# Patient Record
Sex: Female | Born: 1980 | Race: White | Hispanic: No | Marital: Married | Smoking: Never smoker
Health system: Southern US, Community
[De-identification: ages and names within clinical notes are randomized; demographics above are authoritative.]

## PROBLEM LIST (undated history)

## (undated) DIAGNOSIS — Z8719 Personal history of other diseases of the digestive system: Secondary | ICD-10-CM

## (undated) DIAGNOSIS — Z8639 Personal history of other endocrine, nutritional and metabolic disease: Secondary | ICD-10-CM

## (undated) DIAGNOSIS — R002 Palpitations: Secondary | ICD-10-CM

## (undated) DIAGNOSIS — E785 Hyperlipidemia, unspecified: Secondary | ICD-10-CM

## (undated) DIAGNOSIS — M62838 Other muscle spasm: Secondary | ICD-10-CM

## (undated) HISTORY — PX: TONSILLECTOMY: SUR1361

## (undated) HISTORY — PX: DILATION AND CURETTAGE OF UTERUS: SHX78

## (undated) HISTORY — DX: Palpitations: R00.2

## (undated) HISTORY — DX: Personal history of other endocrine, nutritional and metabolic disease: Z86.39

## (undated) HISTORY — DX: Personal history of other diseases of the digestive system: Z87.19

## (undated) HISTORY — DX: Other muscle spasm: M62.838

## (undated) HISTORY — DX: Hyperlipidemia, unspecified: E78.5

## (undated) HISTORY — PX: BREAST SURGERY: SHX581

---

## 2004-04-23 ENCOUNTER — Emergency Department (HOSPITAL_COMMUNITY): Admission: EM | Admit: 2004-04-23 | Discharge: 2004-04-23 | Payer: Self-pay | Admitting: Family Medicine

## 2004-10-03 ENCOUNTER — Other Ambulatory Visit: Admission: RE | Admit: 2004-10-03 | Discharge: 2004-10-03 | Payer: Self-pay | Admitting: Family Medicine

## 2004-10-15 ENCOUNTER — Encounter: Admission: RE | Admit: 2004-10-15 | Discharge: 2004-10-15 | Payer: Self-pay | Admitting: Family Medicine

## 2004-12-01 ENCOUNTER — Emergency Department (HOSPITAL_COMMUNITY): Admission: EM | Admit: 2004-12-01 | Discharge: 2004-12-01 | Payer: Self-pay | Admitting: Family Medicine

## 2005-03-04 ENCOUNTER — Emergency Department (HOSPITAL_COMMUNITY): Admission: EM | Admit: 2005-03-04 | Discharge: 2005-03-04 | Payer: Self-pay | Admitting: Family Medicine

## 2005-09-06 ENCOUNTER — Other Ambulatory Visit: Admission: RE | Admit: 2005-09-06 | Discharge: 2005-09-06 | Payer: Self-pay | Admitting: Family Medicine

## 2005-09-06 ENCOUNTER — Ambulatory Visit: Payer: Self-pay | Admitting: Family Medicine

## 2005-09-17 ENCOUNTER — Encounter: Admission: RE | Admit: 2005-09-17 | Discharge: 2005-09-17 | Payer: Self-pay | Admitting: Family Medicine

## 2006-03-07 ENCOUNTER — Ambulatory Visit: Payer: Self-pay | Admitting: Family Medicine

## 2006-03-18 ENCOUNTER — Ambulatory Visit: Payer: Self-pay

## 2006-04-08 ENCOUNTER — Ambulatory Visit: Payer: Self-pay | Admitting: Family Medicine

## 2006-06-02 ENCOUNTER — Ambulatory Visit: Payer: Self-pay | Admitting: Family Medicine

## 2006-09-18 ENCOUNTER — Encounter: Admission: RE | Admit: 2006-09-18 | Discharge: 2006-09-18 | Payer: Self-pay | Admitting: Family Medicine

## 2006-09-24 ENCOUNTER — Ambulatory Visit: Payer: Self-pay | Admitting: Family Medicine

## 2006-09-24 ENCOUNTER — Other Ambulatory Visit: Admission: RE | Admit: 2006-09-24 | Discharge: 2006-09-24 | Payer: Self-pay | Admitting: Family Medicine

## 2006-09-24 ENCOUNTER — Encounter: Payer: Self-pay | Admitting: Family Medicine

## 2007-02-22 ENCOUNTER — Emergency Department (HOSPITAL_COMMUNITY): Admission: EM | Admit: 2007-02-22 | Discharge: 2007-02-22 | Payer: Self-pay | Admitting: Family Medicine

## 2007-08-05 ENCOUNTER — Ambulatory Visit: Payer: Self-pay | Admitting: Family Medicine

## 2007-08-05 DIAGNOSIS — R599 Enlarged lymph nodes, unspecified: Secondary | ICD-10-CM | POA: Insufficient documentation

## 2007-08-06 LAB — CONVERTED CEMR LAB
Basophils Absolute: 0 10*3/uL (ref 0.0–0.1)
Basophils Relative: 0.3 % (ref 0.0–1.0)
Eosinophils Absolute: 0.1 10*3/uL (ref 0.0–0.6)
Eosinophils Relative: 1.5 % (ref 0.0–5.0)
HCT: 35 % — ABNORMAL LOW (ref 36.0–46.0)
Hemoglobin: 12.2 g/dL (ref 12.0–15.0)
Lymphocytes Relative: 23.3 % (ref 12.0–46.0)
MCHC: 34.7 g/dL (ref 30.0–36.0)
MCV: 86.8 fL (ref 78.0–100.0)
Monocytes Absolute: 0.4 10*3/uL (ref 0.2–0.7)
Monocytes Relative: 6.3 % (ref 3.0–11.0)
Neutro Abs: 4 10*3/uL (ref 1.4–7.7)
Neutrophils Relative %: 68.6 % (ref 43.0–77.0)
Platelets: 245 10*3/uL (ref 150–400)
RBC: 4.03 M/uL (ref 3.87–5.11)
RDW: 12.8 % (ref 11.5–14.6)
WBC: 5.9 10*3/uL (ref 4.5–10.5)

## 2007-11-11 ENCOUNTER — Encounter: Admission: RE | Admit: 2007-11-11 | Discharge: 2007-11-11 | Payer: Self-pay | Admitting: Family Medicine

## 2007-11-13 ENCOUNTER — Encounter (INDEPENDENT_AMBULATORY_CARE_PROVIDER_SITE_OTHER): Payer: Self-pay | Admitting: *Deleted

## 2008-06-13 ENCOUNTER — Encounter (INDEPENDENT_AMBULATORY_CARE_PROVIDER_SITE_OTHER): Payer: Self-pay | Admitting: Obstetrics and Gynecology

## 2008-06-13 ENCOUNTER — Ambulatory Visit (HOSPITAL_COMMUNITY): Admission: RE | Admit: 2008-06-13 | Discharge: 2008-06-13 | Payer: Self-pay | Admitting: Obstetrics and Gynecology

## 2008-08-26 ENCOUNTER — Encounter (INDEPENDENT_AMBULATORY_CARE_PROVIDER_SITE_OTHER): Payer: Self-pay | Admitting: Diagnostic Radiology

## 2008-08-26 ENCOUNTER — Encounter: Admission: RE | Admit: 2008-08-26 | Discharge: 2008-08-26 | Payer: Self-pay | Admitting: Obstetrics and Gynecology

## 2008-10-04 ENCOUNTER — Ambulatory Visit: Payer: Self-pay | Admitting: Family Medicine

## 2008-10-04 DIAGNOSIS — M62838 Other muscle spasm: Secondary | ICD-10-CM | POA: Insufficient documentation

## 2009-06-25 ENCOUNTER — Inpatient Hospital Stay (HOSPITAL_COMMUNITY): Admission: AD | Admit: 2009-06-25 | Discharge: 2009-06-25 | Payer: Self-pay | Admitting: Obstetrics and Gynecology

## 2009-06-26 ENCOUNTER — Inpatient Hospital Stay (HOSPITAL_COMMUNITY): Admission: AD | Admit: 2009-06-26 | Discharge: 2009-06-29 | Payer: Self-pay | Admitting: Obstetrics and Gynecology

## 2009-10-02 ENCOUNTER — Ambulatory Visit: Payer: Self-pay | Admitting: Family Medicine

## 2009-10-02 ENCOUNTER — Telehealth (INDEPENDENT_AMBULATORY_CARE_PROVIDER_SITE_OTHER): Payer: Self-pay | Admitting: *Deleted

## 2009-10-02 DIAGNOSIS — R209 Unspecified disturbances of skin sensation: Secondary | ICD-10-CM | POA: Insufficient documentation

## 2009-10-02 DIAGNOSIS — K5289 Other specified noninfective gastroenteritis and colitis: Secondary | ICD-10-CM

## 2009-10-03 ENCOUNTER — Telehealth (INDEPENDENT_AMBULATORY_CARE_PROVIDER_SITE_OTHER): Payer: Self-pay | Admitting: *Deleted

## 2009-10-03 LAB — CONVERTED CEMR LAB
BUN: 18 mg/dL (ref 6–23)
Basophils Absolute: 0 10*3/uL (ref 0.0–0.1)
Basophils Relative: 0.6 % (ref 0.0–3.0)
CO2: 28 meq/L (ref 19–32)
Calcium: 7.8 mg/dL — ABNORMAL LOW (ref 8.4–10.5)
Chloride: 103 meq/L (ref 96–112)
Creatinine, Ser: 0.7 mg/dL (ref 0.4–1.2)
Eosinophils Absolute: 0 10*3/uL (ref 0.0–0.7)
Eosinophils Relative: 0.2 % (ref 0.0–5.0)
Folate: >20 ng/mL
GFR calc non Af Amer: 104.25 mL/min (ref >60)
Glucose, Bld: 89 mg/dL (ref 70–99)
HCT: 36 % (ref 36.0–46.0)
Hemoglobin: 12.5 g/dL (ref 12.0–15.0)
Lymphocytes Relative: 7 % — ABNORMAL LOW (ref 12.0–46.0)
Lymphs Abs: 0.5 10*3/uL — ABNORMAL LOW (ref 0.7–4.0)
MCHC: 34.6 g/dL (ref 30.0–36.0)
MCV: 87.6 fL (ref 78.0–100.0)
Magnesium: 1.1 mg/dL — ABNORMAL LOW (ref 1.5–2.5)
Monocytes Absolute: 0.5 10*3/uL (ref 0.1–1.0)
Monocytes Relative: 6.9 % (ref 3.0–12.0)
Neutro Abs: 5.8 10*3/uL (ref 1.4–7.7)
Neutrophils Relative %: 85.3 % — ABNORMAL HIGH (ref 43.0–77.0)
Platelets: 174 10*3/uL (ref 150.0–400.0)
Potassium: 2.8 meq/L — CL (ref 3.5–5.1)
RBC: 4.11 M/uL (ref 3.87–5.11)
RDW: 12.5 % (ref 11.5–14.6)
Sodium: 139 meq/L (ref 135–145)
Vitamin B-12: 373 pg/mL (ref 211–911)
WBC: 6.8 10*3/uL (ref 4.5–10.5)

## 2009-10-05 ENCOUNTER — Ambulatory Visit: Payer: Self-pay | Admitting: Family Medicine

## 2009-10-10 LAB — CONVERTED CEMR LAB
BUN: 10 mg/dL (ref 6–23)
CO2: 21 meq/L (ref 19–32)
Calcium: 10.5 mg/dL (ref 8.4–10.5)
Chloride: 101 meq/L (ref 96–112)
Creatinine, Ser: 0.6 mg/dL (ref 0.4–1.2)
GFR calc non Af Amer: 124.54 mL/min (ref >60)
Glucose, Bld: 71 mg/dL (ref 70–99)
Potassium: 4.4 meq/L (ref 3.5–5.1)
Sodium: 137 meq/L (ref 135–145)

## 2009-10-12 ENCOUNTER — Ambulatory Visit: Payer: Self-pay | Admitting: Family Medicine

## 2009-10-13 ENCOUNTER — Telehealth (INDEPENDENT_AMBULATORY_CARE_PROVIDER_SITE_OTHER): Payer: Self-pay | Admitting: *Deleted

## 2009-10-13 LAB — CONVERTED CEMR LAB
BUN: 18 mg/dL (ref 6–23)
CO2: 27 meq/L (ref 19–32)
Calcium: 9 mg/dL (ref 8.4–10.5)
Chloride: 103 meq/L (ref 96–112)
Creatinine, Ser: 0.5 mg/dL (ref 0.4–1.2)
GFR calc non Af Amer: 153.69 mL/min (ref >60)
Glucose, Bld: 73 mg/dL (ref 70–99)
Magnesium: 1.9 mg/dL (ref 1.5–2.5)
Potassium: 3.9 meq/L (ref 3.5–5.1)
Sodium: 138 meq/L (ref 135–145)

## 2010-02-13 ENCOUNTER — Ambulatory Visit: Payer: Self-pay | Admitting: Family Medicine

## 2010-02-13 DIAGNOSIS — E876 Hypokalemia: Secondary | ICD-10-CM | POA: Insufficient documentation

## 2010-03-02 ENCOUNTER — Ambulatory Visit: Payer: Self-pay | Admitting: Family Medicine

## 2010-03-02 DIAGNOSIS — E785 Hyperlipidemia, unspecified: Secondary | ICD-10-CM | POA: Insufficient documentation

## 2010-03-13 ENCOUNTER — Telehealth (INDEPENDENT_AMBULATORY_CARE_PROVIDER_SITE_OTHER): Payer: Self-pay | Admitting: *Deleted

## 2010-03-13 LAB — CONVERTED CEMR LAB
ALT: 20 units/L (ref 0–35)
AST: 24 units/L (ref 0–37)
Albumin: 4.4 g/dL (ref 3.5–5.2)
Alkaline Phosphatase: 59 units/L (ref 39–117)
BUN: 14 mg/dL (ref 6–23)
Basophils Absolute: 0 10*3/uL (ref 0.0–0.1)
Basophils Relative: 0 % (ref 0.0–3.0)
Bilirubin, Direct: 0.1 mg/dL (ref 0.0–0.3)
CO2: 31 meq/L (ref 19–32)
Calcium: 9.8 mg/dL (ref 8.4–10.5)
Chloride: 107 meq/L (ref 96–112)
Cholesterol: 201 mg/dL — ABNORMAL HIGH (ref 0–200)
Creatinine, Ser: 0.7 mg/dL (ref 0.4–1.2)
Direct LDL: 152.7 mg/dL
Eosinophils Absolute: 0.1 10*3/uL (ref 0.0–0.7)
Eosinophils Relative: 1.7 % (ref 0.0–5.0)
GFR calc non Af Amer: 103.97 mL/min (ref >60)
Glucose, Bld: 80 mg/dL (ref 70–99)
HCT: 38.6 % (ref 36.0–46.0)
HDL: 41.3 mg/dL (ref >39.00)
Hemoglobin: 12.8 g/dL (ref 12.0–15.0)
Lymphocytes Relative: 21.2 % (ref 12.0–46.0)
Lymphs Abs: 1.6 10*3/uL (ref 0.7–4.0)
MCHC: 33.1 g/dL (ref 30.0–36.0)
MCV: 87.7 fL (ref 78.0–100.0)
Magnesium: 1.8 mg/dL (ref 1.5–2.5)
Monocytes Absolute: 0.5 10*3/uL (ref 0.1–1.0)
Monocytes Relative: 6.2 % (ref 3.0–12.0)
Neutro Abs: 5.5 10*3/uL (ref 1.4–7.7)
Neutrophils Relative %: 70.9 % (ref 43.0–77.0)
Platelets: 214 10*3/uL (ref 150.0–400.0)
Potassium: 3.6 meq/L (ref 3.5–5.1)
RBC: 4.4 M/uL (ref 3.87–5.11)
RDW: 13.1 % (ref 11.5–14.6)
Sodium: 142 meq/L (ref 135–145)
TSH: 5.77 microintl units/mL — ABNORMAL HIGH (ref 0.35–5.50)
Total Bilirubin: 0.3 mg/dL (ref 0.3–1.2)
Total CHOL/HDL Ratio: 5
Total Protein: 8.1 g/dL (ref 6.0–8.3)
Triglycerides: 97 mg/dL (ref 0.0–149.0)
VLDL: 19.4 mg/dL (ref 0.0–40.0)
WBC: 7.7 10*3/uL (ref 4.5–10.5)

## 2010-03-27 ENCOUNTER — Ambulatory Visit: Payer: Self-pay | Admitting: Family

## 2010-03-27 DIAGNOSIS — H6690 Otitis media, unspecified, unspecified ear: Secondary | ICD-10-CM

## 2010-03-27 DIAGNOSIS — E039 Hypothyroidism, unspecified: Secondary | ICD-10-CM | POA: Insufficient documentation

## 2010-03-27 DIAGNOSIS — J019 Acute sinusitis, unspecified: Secondary | ICD-10-CM | POA: Insufficient documentation

## 2010-03-27 LAB — CONVERTED CEMR LAB
Free T4: 0.7 ng/dL (ref 0.6–1.6)
T3, Free: 2.9 pg/mL (ref 2.3–4.2)
TSH: 3.3 microintl units/mL (ref 0.35–5.50)

## 2010-06-07 ENCOUNTER — Ambulatory Visit: Payer: Self-pay | Admitting: Family Medicine

## 2010-06-07 DIAGNOSIS — M7660 Achilles tendinitis, unspecified leg: Secondary | ICD-10-CM | POA: Insufficient documentation

## 2010-10-25 ENCOUNTER — Ambulatory Visit: Payer: Self-pay | Admitting: Family Medicine

## 2011-01-19 ENCOUNTER — Encounter: Payer: Self-pay | Admitting: Family Medicine

## 2011-01-20 ENCOUNTER — Encounter: Payer: Self-pay | Admitting: Obstetrics and Gynecology

## 2011-01-29 NOTE — Progress Notes (Signed)
Summary: discuss labs  Phone Note Outgoing Call   Call placed by: Doristine Devoid,  March 13, 2010 10:18 AM Call placed to: Patient Summary of Call: Pt maybe hypothyroid----recheck TSH, free T3,  free T4  244.9----next week Ideally your LDL (bad cholesterol) should be <_100___, your HDL (good cholesterol) should be >__50_ and your triglycerides should be< 150.  Diet and exercise will increase HDL and decrease the LDL and triglycerides. Read Dr. Danice Goltz book--Eat Drink and Be Healthy.  We will recheck labs in 3-6 months-----272.4  hep, lipid  Follow-up for Phone Call        patient aware of labs and appt scheduled to recheck.Marland KitchenMarland KitchenDoristine Devoid  March 13, 2010 10:18 AM

## 2011-01-29 NOTE — Progress Notes (Signed)
Summary: discuss labs  Phone Note Outgoing Call   Call placed by: Doristine Devoid,  October 13, 2009 4:37 PM Call placed to: Patient Summary of Call: all labs look good!  continue K+ 20 meq daily, continue Magnesium, con't Ca two times a day.  call with any questions or concerns  Follow-up for Phone Call        spoke with patient aware of labs and to continue meds.................Marland KitchenDoristine Devoid  October 13, 2009 4:37 PM     New/Updated Medications: KLOR-CON M20 20 MEQ CR-TABS (POTASSIUM CHLORIDE CRYS CR) take one tablet daily Prescriptions: KLOR-CON M20 20 MEQ CR-TABS (POTASSIUM CHLORIDE CRYS CR) take one tablet daily  #30 x 1   Entered by:   Doristine Devoid   Authorized by:   Neena Rhymes MD   Signed by:   Doristine Devoid on 10/13/2009   Method used:   [facility] to        [facility] [phone]* (retail)       [address]       [place], [place]  [address]       Ph: [phone]       Fax: [phone]   RxID:   [id number] SLOW-MAG 71.5-119 MG TBEC (MAGNESIUM CL-CALCIUM CARBONATE) take one tablet two times a day  #30 x 1   Entered by:   Doristine Devoid   Authorized by:   Neena Rhymes MD   Signed by:   Doristine Devoid on 10/13/2009   Method used:   Electronically to        [facility] [phone]* (retail)       [address]       [place], [place]  [address]       Ph: [phone]       Fax: [phone]   RxID:   [id number] KLOR-CON M20 20 MEQ CR-TABS (POTASSIUM CHLORIDE CRYS CR) take 2 tablets today, 1 two times a day on tues., then 1 tablet wed. and thurs.  #30 x 1   Entered by:   Doristine Devoid   Authorized by:   Neena Rhymes MD   Signed by:   Doristine Devoid on 10/13/2009   Method used:   Electronically to        [facility] [phone]* (retail)       [address]       [place], [place]  [address]       Ph: [phone]       Fax: [phone]   RxID:    [id number]

## 2011-01-29 NOTE — Letter (Signed)
   [facility] [address] [place], [place] [address] [phone]    March 27, 2010   [facility] [address] [place], [place] [address]  RE:  LAB RESULTS  Dear  Ms. Grandberry,  The following is an interpretation of your most recent lab tests.  Please take note of any instructions provided or changes to medications that have resulted from your lab work.   THYROID STUDIES:  Thyroid studies normal TSH: 3.30      Sincerely Yours,    Lemont Fillers FNP

## 2011-01-29 NOTE — Assessment & Plan Note (Signed)
Summary: muscle spasms//lch   Vital Signs:  Patient profile:   30 year old female Height:      62 inches Weight:      114 pounds BMI:     20.93 Temp:     97.4 degrees F oral Pulse rate:   73 / minute Pulse rhythm:   regular BP sitting:   98 / 60  (left arm) Cuff size:   regular  Vitals Entered By: Army Fossa CMA (February 13, 2010 3:59 PM) CC: Pt c/o upper back and neck pain onset yesterday. Pain is worse when laying down.    History of Present Illness: Pt here c/o pain back of neck.  Same as she previously had.   No new injury--pt sits at computer all day.  She is a IT consultant.    Current Medications (verified): 1)  Flexeril 10 Mg Tabs (Cyclobenzaprine Hcl) .Marland Kitchen.. 1 By Mouth Three Times A Day 2)  Prenatal Multivitamin-Ultra  Tabs (Prenatal Vit-Dss-Fe Cbn-Fa)  Allergies (verified): No Known Drug Allergies  Past History:  Past medical, surgical, family and social histories (including risk factors) reviewed for relevance to current acute and chronic problems.  Past Medical History: Reviewed history from 08/05/2007 and no changes required. Unremarkable  Family History: Reviewed history and no changes required.  Social History: Reviewed history and no changes required.  Review of Systems      See HPI  Physical Exam  General:  Well-developed,well-nourished,in no acute distress; alert,appropriate and cooperative throughout examination Neck:  No deformities, masses, or tenderness noted. Msk:  + muscle spasm b/ l traps normal ROM.   Neurologic:  alert & oriented X3 and gait normal.   Psych:  Oriented X3 and normally interactive.     Impression & Recommendations:  Problem # 1:  MUSCLE SPASM, TRAPEZIUS (ICD-728.85) flexeril 10 mg three times a day as needed  stretching warm moist compresses  Problem # 2:  HYPOKALEMIA (ICD-276.8) recheck labs  Complete Medication List: 1)  Flexeril 10 Mg Tabs (Cyclobenzaprine hcl) .Marland Kitchen.. 1 by mouth three times a day 2)   Prenatal Multivitamin-ultra Tabs (Prenatal vit-dss-fe cbn-fa)  Patient Instructions: 1)  276.8  272.4  lipid, bmp, hep, cbcd, tsh  Prescriptions: FLEXERIL 10 MG TABS (CYCLOBENZAPRINE HCL) 1 by mouth three times a day  #30 x 1   Entered and Authorized by:   Loreen Freud DO   Signed by:   Loreen Freud DO on 02/13/2010   Method used:   Electronically to        [facility] [phone]* (retail)       [address]       [place], [place]  [address]       Ph: [phone]       Fax: [phone]   RxID:   [id number]

## 2011-01-29 NOTE — Assessment & Plan Note (Signed)
Summary: lump on her right ear//ca  Medications Added TRI-SPRINTEC 0.035 MG TABS (NORGESTIMATE-ETHINYL ESTRADIOL)  AUGMENTIN 875-125 MG  TABS (AMOXICILLIN-POT CLAVULANATE) 1 by mouth two times a day      Allergies Added: NKDA  Vital Signs:  Patient Profile:   30 Years Old Female Weight:      109.4 pounds Pulse rate:   72 / minute BP sitting:   110 / 78  (left arm) Cuff size:   regular  Vitals Entered By: Shonna Chock (August 05, 2007 12:29 PM)               PCP:  Laury Axon  Chief Complaint:  LOOK AT LUMP ON RIGHT EAR ONSET: MON.  History of Present Illness: Pt here c/o lump behind R ear since Monday.  No other symptoms.    Current Allergies: No known allergies   Past Medical History:    Unremarkable     Review of Systems      See HPI  General      Denies chills, fatigue, fever, loss of appetite, malaise, sleep disorder, sweats, weakness, and weight loss.  ENT      Denies decreased hearing, difficulty swallowing, ear discharge, earache, hoarseness, nasal congestion, nosebleeds, postnasal drainage, ringing in ears, sinus pressure, and sore throat.  CV      Denies bluish discoloration of lips or nails, chest pain or discomfort, difficulty breathing at night, difficulty breathing while lying down, fainting, fatigue, leg cramps with exertion, lightheadness, near fainting, palpitations, shortness of breath with exertion, swelling of feet, swelling of hands, and weight gain.  Resp      Denies chest discomfort, chest pain with inspiration, cough, coughing up blood, excessive snoring, hypersomnolence, morning headaches, pleuritic, shortness of breath, sputum productive, and wheezing.   Physical Exam  General:     Well-developed,well-nourished,in no acute distress; alert,appropriate and cooperative throughout examination Head:     Normocephalic and atraumatic without obvious abnormalities. No apparent alopecia or balding. Ears:     External ear exam shows no  significant lesions or deformities.  Otoscopic examination reveals clear canals, tympanic membranes are intact bilaterally without bulging, retraction, inflammation or discharge. Hearing is grossly normal bilaterally. Nose:     External nasal examination shows no deformity or inflammation. Nasal mucosa are pink and moist without lesions or exudates. Mouth:     Oral mucosa and oropharynx without lesions or exudates.  Teeth in good repair. Neck:     R cervical adenopathy Lungs:     Normal respiratory effort, chest expands symmetrically. Lungs are clear to auscultation, no crackles or wheezes. Heart:     Normal rate and regular rhythm. S1 and S2 normal without gallop, murmur, click, rub or other extra sounds.    Impression & Recommendations:  Problem # 1:  CERVICAL LYMPHADENOPATHY, ANTERIOR, RIGHT (ICD-785.6) Rto after abx if no better ---consider US neck Her updated medication list for this problem includes:    Augmentin 875-125 Mg Tabs (Amoxicillin-pot clavulanate) .Marland Kitchen... 1 by mouth two times a day  Orders: Venipuncture (16109) TLB-CBC Platelet - w/Differential (85025-CBCD) Warm, moist compresses and symptomatic measures. Advised patient to complete all antibiotics.  Complete Medication List: 1)  Tri-sprintec 0.035 Mg Tabs (Norgestimate-ethinyl estradiol) 2)  Augmentin 875-125 Mg Tabs (Amoxicillin-pot clavulanate) .Marland Kitchen.. 1 by mouth two times a day     Prescriptions: AUGMENTIN 875-125 MG  TABS (AMOXICILLIN-POT CLAVULANATE) 1 by mouth two times a day  #20 x 0   Entered and Authorized by:   Loreen Freud DO  Signed by:   Loreen Freud DO on 08/05/2007   Method used:   Print then Give to Patient   RxID:   [id number]

## 2011-01-29 NOTE — Assessment & Plan Note (Signed)
Summary: sinus infection/labs,TSH, free T3,  free T4  244.9-/kdc   Vital Signs:  Patient profile:   30 year old female Height:      62 inches Weight:      109.38 pounds BMI:     20.08 Temp:     97.3 degrees F oral Pulse rate:   102 / minute Pulse rhythm:   regular Resp:     16 per minute BP sitting:   100 / 80  (right arm) Cuff size:   regular  Vitals Entered By: Mervin Kung CMA (March 27, 2010 11:43 AM) CC: room 16  Head congestion x 1 week. Ears feel stopped up bilaterally.    Primary Care Provider:  Laury Axon  CC:  room 16  Head congestion x 1 week. Ears feel stopped up bilaterally. Marland Kitchen  History of Present Illness: [name] presents with c/o sinus congestion x 7 days.  + Yellow nasal discharge and + sinus pressure.  Denies blurred vision.  99.8 fever on Saturday. Has tried sudafed x 1 minimal improvement.  Has also tried tylenol.  Patient has a 75 month old baby who is still nursing.   Also c/o right ear pain over the weekend, now pain is better but patient feels congested in both ears.    Allergies (verified): 1)  ! * Shellfish  Physical Exam  General:  Well-developed,well-nourished,in no acute distress; alert,appropriate and cooperative throughout examination Head:  Normocephalic and atraumatic without obvious abnormalities. No apparent alopecia or balding. Ears:  Bilateral bright red steaking at tops of TM's, + retraction Mouth:  Oral mucosa and oropharynx without lesions or exudates.  Teeth in good repair. Neck:  No deformities, masses, or tenderness noted. Lungs:  Normal respiratory effort, chest expands symmetrically. Lungs are clear to auscultation, no crackles or wheezes. Heart:  Normal rate and regular rhythm. S1 and S2 normal without gallop, murmur, click, rub or other extra sounds.   Impression & Recommendations:  Problem # 1:  SINUSITIS, ACUTE (ICD-461.9) Assessment New Will treat with amoxicillin, pt instructed to f/u per instructions below.   Her  updated medication list for this problem includes:    Amoxicillin 500 Mg Cap (Amoxicillin) .Marland Kitchen... Take 1 capsule by mouth three times a day x 10 days  Problem # 2:  HYPOTHYROIDISM (ICD-244.9) Patient was noted to have mild elevation of TSH on last lab draw. Denies fatigue or weight gain, will check TFT's Orders: Venipuncture (16109) TLB-TSH (Thyroid Stimulating Hormone) (84443-TSH) TLB-T3, Free (Triiodothyronine) (84481-T3FREE) TLB-T4 (Thyrox), Free [phone])  Problem # 3:  OTITIS MEDIA, BILATERAL (ICD-382.9) Assessment: New  Her updated medication list for this problem includes:    Amoxicillin 500 Mg Cap (Amoxicillin) .Marland Kitchen... Take 1 capsule by mouth three times a day x 10 days  Complete Medication List: 1)  Prenatal Multivitamin-ultra Tabs (Prenatal vit-dss-fe cbn-fa) 2)  Citracal Plus Tabs (Multiple minerals-vitamins) .... Take 1 tablet by mouth once a day 3)  Fenugreek  .... Take 1 capsule by mouth two times a day 4)  Amoxicillin 500 Mg Cap (Amoxicillin) .... Take 1 capsule by mouth three times a day x 10 days  Patient Instructions: 1)  Call if you develop fever over 101, increasing sinus pressure, pain with eye movement, increased facial tenderness of swelling, or if you develop visual changes. Prescriptions: AMOXICILLIN 500 MG CAP (AMOXICILLIN) Take 1 capsule by mouth three times a day X 10 days  #30 x 0   Entered and Authorized by:   Lemont Fillers FNP   Signed  by:   Lemont Fillers FNP on 03/27/2010   Method used:   Electronically to        [facility] [phone]* (retail)       [address]       [place], [place]  [address]       Ph: [phone]       Fax: [phone]   RxID:   [id number]   Current Allergies (reviewed today): ! * SHELLFISH

## 2011-01-29 NOTE — Assessment & Plan Note (Signed)
Summary: left ankle injury//kn   Vital Signs:  Patient profile:   30 year old female Weight:      110.38 pounds Pulse rate:   86 / minute Pulse rhythm:   regular BP sitting:   102 / 76  (left arm) Cuff size:   regular  Vitals Entered By: Army Fossa CMA (June 07, 2010 3:52 PM) CC: Pt here c/o left ankle pain x 2 weeks, not a severe pain. Noticied some swelling.   History of Present Illness:  Injury      This is a 30 year old woman who presents with An injury.  The symptoms began 2 weeks ago.  Pt felt pain in L ankle/ heel  while running about  1 1/2 weeks ago.  The patient reports injury to the left ankle.  The patient also reports swelling and tenderness.  The patient denies redness, increased warmth deformity, blood loss, numbness, weakness, loss of sensation, coolness of extremity, and loss of consciousness.  The patient denies the following risk factors for significant bleeding: aspirin use, anticoagulant use, and history of bleeding disorder.  Screening for risk of abuse was negative.  Pain has gotten worse since it occurred.    Current Medications (verified): 1)  Prenatal Multivitamin-Ultra  Tabs (Prenatal Vit-Dss-Fe Cbn-Fa) 2)  Citracal Plus  Tabs (Multiple Minerals-Vitamins) .... Take 1 Tablet By Mouth Once A Day 3)  Fenugreek .... Take 1 Capsule By Mouth Two Times A Day  Allergies: 1)  ! * Shellfish  Review of Systems      See HPI  Physical Exam  General:  Well-developed,well-nourished,in no acute distress; alert,appropriate and cooperative throughout examination Msk:  L ankle swollen around achilles,  no reddness , normal ROM and no joint warmth.   Pulses:  R posterior tibial normal, R dorsalis pedis normal, L posterior tibial normal, and L dorsalis pedis normal.   Neurologic:  alert & oriented X3.   Psych:  Oriented X3 and normally interactive.     Impression & Recommendations:  Problem # 1:  ACHILLES TENDINITIS, MILD  (ICD-726.71) ace elevation ice Ibuprofen rest call if no better or worsens and we will refer to ortho  Complete Medication List: 1)  Prenatal Multivitamin-ultra Tabs (Prenatal vit-dss-fe cbn-fa) 2)  Citracal Plus Tabs (Multiple minerals-vitamins) .... Take 1 tablet by mouth once a day 3)  Fenugreek  .... Take 1 capsule by mouth two times a day

## 2011-01-29 NOTE — Progress Notes (Signed)
       New/Updated Medications: SLOW-MAG 71.5-119 MG TBEC (MAGNESIUM CL-CALCIUM CARBONATE) take one tablet two times a day Prescriptions: SLOW-MAG 71.5-119 MG TBEC (MAGNESIUM CL-CALCIUM CARBONATE) take one tablet two times a day  #20 x 0   Entered by:   Doristine Devoid   Authorized by:   Neena Rhymes MD   Signed by:   Doristine Devoid on 10/03/2009   Method used:   Electronically to        [facility] [phone]* (retail)       [address]       [place], [place]  [address]       Ph: [phone]       Fax: [phone]   RxID:   [id number]   Appended Document:  discussed labs and prescription sent to pharmacy

## 2011-01-29 NOTE — Assessment & Plan Note (Signed)
Summary: nausea and vomiting/alr   Vital Signs:  Patient profile:   30 year old female Weight:      117 pounds Temp:     99.8 degrees F oral BP sitting:   110 / 66  (left arm)  Vitals Entered By: Doristine Devoid (October 02, 2009 1:33 PM) CC: nausea and vomitting along w/ diarrhea xsun.   History of Present Illness: 30 yo woman here today for nausea and vomiting.  sxs started after attending birthday party on Saturday.  vomiting didn't start until Sunday night, x3, and then developed diarrhea this AM x1.  no blood in stools.  denies abdominal pain.  + nausea.  tolerating fluids w/out difficulty.  2 other adults and 2 children also developed similar sxs.  subjective temps at home, low grade in office.  has 34 month old at home that she is breast-feeding.  at birthday party had [employer] nuggets and 'slow cooker pasta w/ beef and cheese'.  but remainder of people from party are well.  also c/o numbness in both arms from mid forearm into fingers, less in legs, and some tingling in face.  c/o weakness in grip.  no sxs prior to this AM.  Current Medications (verified): 1)  None  Allergies (verified): No Known Drug Allergies  Past History:  Past Medical History: Last updated: 08/05/2007 Unremarkable  Review of Systems      See HPI  Physical Exam  General:  pale, uncomfortable appearing Mouth:  MMM Lungs:  Normal respiratory effort, chest expands symmetrically. Lungs are clear to auscultation, no crackles or wheezes. Heart:  Normal rate and regular rhythm. S1 and S2 normal without gallop, murmur, click, rub or other extra sounds. Abdomen:  soft, NT/ND, + BS Pulses:  +2 radial, DP Extremities:  no C/C/E Neurologic:  strength in arms and legs 5/5 excellent discrimination w/ monofilament testing sensation intact to light touch   Impression & Recommendations:  Problem # 1:  GASTROENTERITIS (ICD-558.9) Assessment New likely viral.  will check electrolytes and blood count.   stressed importance of fluid, BRAT diet.  no phenergan as she is still breast feeding.  reviewed supportive care and red flags that should prompt return.  Pt expresses understanding and is in agreement w/ this plan. Orders: Venipuncture (16109) TLB-BMP (Basic Metabolic Panel-BMET) (80048-METABOL) TLB-CBC Platelet - w/Differential (85025-CBCD)  Problem # 2:  PARESTHESIA, HANDS (ICD-782.0) Assessment: New likely related to current illness as sxs coincide.  possibly positional.  bilateral sxs reassuring that nothing neurologic.  will check labs to ensure no vitamin deficiency.  Pt expresses understanding and is in agreement w/ this plan. Orders: TLB-B12 + Folate Pnl ([id number])  Patient Instructions: 1)  Please schedule a follow-up appointment as needed.  2)  This is likely viral and should continue to improve with time 3)  Please drink plenty of fluids- you can eat whenever you feel ready 4)  Immodium for diarrhea 5)  The tingling should improve as you do, but if not- please call 6)  Hang in there!! Prescriptions: PROMETHAZINE HCL 25 MG  TABS (PROMETHAZINE HCL) 1 tab by mouth Q6 as needed for nausea  #20 x 0   Entered and Authorized by:   Neena Rhymes MD   Signed by:   Neena Rhymes MD on 10/02/2009   Method used:   Print then Give to Patient   RxID:   [id number]

## 2011-01-29 NOTE — Assessment & Plan Note (Signed)
Summary: flu shot.cbs  Nurse Visit   Allergies: 1)  ! * Shellfish  Orders Added: 1)  Admin 1st Vaccine [90471] 2)  Flu Vaccine 78yrs + [16109] Flu Vaccine Consent Questions     Do you have a history of severe allergic reactions to this vaccine? no    Any prior history of allergic reactions to egg and/or gelatin? no    Do you have a sensitivity to the preservative Thimersol? no    Do you have a past history of Guillan-Barre Syndrome? no    Do you currently have an acute febrile illness? no    Have you ever had a severe reaction to latex? no    Vaccine information given and explained to patient? yes    Are you currently pregnant? no    Lot Number:[id number]   Exp Date:06/29/2011   Site Given  Left Deltoid IM .lbflu

## 2011-03-11 ENCOUNTER — Other Ambulatory Visit: Payer: Self-pay | Admitting: Obstetrics and Gynecology

## 2011-03-11 DIAGNOSIS — Z1231 Encounter for screening mammogram for malignant neoplasm of breast: Secondary | ICD-10-CM

## 2011-03-14 ENCOUNTER — Ambulatory Visit: Payer: Self-pay

## 2011-03-20 ENCOUNTER — Ambulatory Visit
Admission: RE | Admit: 2011-03-20 | Discharge: 2011-03-20 | Disposition: A | Discharge status: Home / Self Care | Payer: 59 | Source: Ambulatory Visit | Attending: Obstetrics and Gynecology | Admitting: Obstetrics and Gynecology

## 2011-03-20 DIAGNOSIS — Z1231 Encounter for screening mammogram for malignant neoplasm of breast: Secondary | ICD-10-CM

## 2011-04-08 LAB — CBC
HCT: 32.6 % — ABNORMAL LOW (ref 36.0–46.0)
HCT: 33 % — ABNORMAL LOW (ref 36.0–46.0)
HCT: 35.2 % — ABNORMAL LOW (ref 36.0–46.0)
HCT: 38.2 % (ref 36.0–46.0)
Hemoglobin: 11.3 g/dL — ABNORMAL LOW (ref 12.0–15.0)
Hemoglobin: 11.6 g/dL — ABNORMAL LOW (ref 12.0–15.0)
Hemoglobin: 12.3 g/dL (ref 12.0–15.0)
Hemoglobin: 13.1 g/dL (ref 12.0–15.0)
MCHC: 34.4 g/dL (ref 30.0–36.0)
MCHC: 34.6 g/dL (ref 30.0–36.0)
MCHC: 35 g/dL (ref 30.0–36.0)
MCHC: 35.2 g/dL (ref 30.0–36.0)
MCV: 89.5 fL (ref 78.0–100.0)
MCV: 90 fL (ref 78.0–100.0)
MCV: 90.2 fL (ref 78.0–100.0)
MCV: 90.9 fL (ref 78.0–100.0)
Platelets: 125 10*3/uL — ABNORMAL LOW (ref 150–400)
Platelets: 134 10*3/uL — ABNORMAL LOW (ref 150–400)
Platelets: 137 10*3/uL — ABNORMAL LOW (ref 150–400)
Platelets: 155 10*3/uL (ref 150–400)
RBC: 3.59 MIL/uL — ABNORMAL LOW (ref 3.87–5.11)
RBC: 3.67 MIL/uL — ABNORMAL LOW (ref 3.87–5.11)
RBC: 3.9 MIL/uL (ref 3.87–5.11)
RBC: 4.27 MIL/uL (ref 3.87–5.11)
RDW: 13.8 % (ref 11.5–15.5)
RDW: 13.9 % (ref 11.5–15.5)
RDW: 14.1 % (ref 11.5–15.5)
RDW: 14.4 % (ref 11.5–15.5)
WBC: 10.4 10*3/uL (ref 4.0–10.5)
WBC: 12.6 10*3/uL — ABNORMAL HIGH (ref 4.0–10.5)
WBC: 14.4 10*3/uL — ABNORMAL HIGH (ref 4.0–10.5)
WBC: 7.4 10*3/uL (ref 4.0–10.5)

## 2011-04-08 LAB — CCBB MATERNAL DONOR DRAW

## 2011-04-08 LAB — RPR: RPR Ser Ql: NONREACTIVE

## 2011-05-14 NOTE — H&P (Signed)
NAME:  Felicia Rollins, Felicia Rollins NO.:  [id number]   MEDICAL RECORD NO.:  [id number]          PATIENT TYPE:  AMB   LOCATION:  SDC                           FACILITY:  [facility]   PHYSICIAN:  Huel Cote, M.D. DATE OF BIRTH:  1981/05/15   DATE OF ADMISSION:  DATE OF DISCHARGE:                              HISTORY & PHYSICAL   The patient is a 30 year old G1 P0 who is coming in for a scheduled D&E  secondary to a finding of a fetal demise at approximately [redacted] weeks  gestation.  The patient had an initial ultrasound performed  approximately a week and half ago with a crown-rump length of 8 weeks 1  day and fetal heart motion which was slightly slower than expected and  had a follow-up ultrasound 1 week later which confirmed no cardiac  activity consistent with a fetal demise at 8 weeks 1 day.  She was  counseled regarding her options and wished to proceed with a D&C.   PAST MEDICAL HISTORY:  None.   PAST SURGICAL HISTORY:  Tonsillectomy.   PAST GYNECOLOGIC HISTORY:  No abnormal Pap smears.   MEDICATIONS:  None.   ALLERGIES:  ALLERGIC TO SHELLFISH ONLY.   FAMILY HISTORY:  Significant for her mother having breast cancer at 93  years old with a double mastectomy.  There are no birth defects either  in she or her partner's family.   PHYSICAL EXAMINATION:  Her weight is 113 pounds.  Her blood pressure is  normal at 94/60.  CARDIAC EXAM:  Is regular rate and rhythm.  LUNGS:  Clear.  ABDOMEN:  Is soft and nontender.  On pelvic exam, uterus is consistent with probable 8-9 weeks' gestation.   She was counseled as to the risks and benefits of surgery including  bleeding and uterine perforation.  She understands the risks of D&E and  desires to proceed with the surgery stated.  We discussed the use of  Cytotec to minimize her surgical risk of perforation, and she is  agreeable to this.  She will place a Cytotec tablet 400 mcg in the  vagina 2 to 3 hours prior to the procedure on  the day of surgery.  All  questions were answered, and the procedure reviewed in detail, and the  patient is comfortable with this decision.      Huel Cote, M.D.  Electronically Signed     KR/MEDQ  D:  06/10/2008  T:  06/10/2008  Job:  [id number]

## 2011-05-14 NOTE — Op Note (Signed)
Felicia Rollins, Felicia Rollins NO.:  [id number]   MEDICAL RECORD NO.:  [id number]          PATIENT TYPE:  AMB   LOCATION:  SDC                           FACILITY:  [facility]   PHYSICIAN:  Huel Cote, M.D. DATE OF BIRTH:  [birth year]   DATE OF PROCEDURE:  06/13/2008  DATE OF DISCHARGE:                               OPERATIVE REPORT   PREOPERATIVE DIAGNOSIS:  Missed abortion at 8+ weeks.   POSTOPERATIVE DIAGNOSIS:  Missed abortion at 8+ weeks.   PROCEDURE:  Suction D&E.   ANESTHESIA:  MAC with 1% paracervical block of lidocaine.   SPECIMENS:  Products of conception were sent to pathology.   FINDINGS:  The uterus was 8-week size.   ESTIMATED BLOOD LOSS:  Minimal.   COMPLICATIONS:  None.   PROCEDURE:  The patient was taken to the operating room where MAC  anesthesia was obtained without difficulty.  She was then prepped and  draped in normal sterile fashion in the dorsal lithotomy position.  A  speculum was placed within the vagina after sterile prep, and the cervix  was identified and injected with 2 mL of lidocaine on the anterior lip  and grasped with tenaculum.  An additional 18 mL was injected with  approximately 9 mL at each 2 and 10 o'clock position for a paracervical  block.  The uterus was sounded to approximately 8 with the sound easily,  secondary to the patient's previous Cytotec placement.  She was then  sequentially dilated with the dilators up to approximately 24.  The 8-mm  suction curette was then introduced into the fundus without difficulty  and suction applied.  In several passes, a moderate amount of products  of conception were obtained, and this was repeated until no further  tissue appeared forthcoming.  Suction was then discontinued and a sharp  curette was placed into the uterus.  Curetting of all four quadrants  produced no further significant tissue.  There was some decidua present.  Therefore, 2 more passes with the suction were performed;  however, no  further tissue was obtained.  The patient had very minimal bleeding at  the conclusion of the procedure.  The tenaculum was removed and one area  of tenaculum site bleeding was treated with silver nitrate.  The sponge,  lap, and needle counts were correct x2, and the patient was then  awakened and taken to the recovery room in stable condition.      Huel Cote, M.D.  Electronically Signed     KR/MEDQ  D:  06/13/2008  T:  06/13/2008  Job:  [id number]

## 2011-05-14 NOTE — Discharge Summary (Signed)
Felicia Rollins, Felicia Rollins             ACCOUNT NO.:  [id number]   MEDICAL RECORD NO.:  [id number]          PATIENT TYPE:  INP   LOCATION:  9124                          FACILITY:  [facility]   PHYSICIAN:  Sherron Monday, MD        DATE OF BIRTH:  08-28-1981   DATE OF ADMISSION:  06/26/2009  DATE OF DISCHARGE:  06/29/2009                               DISCHARGE SUMMARY   ADMITTING DIAGNOSIS:  Intrauterine pregnancy at term in early labor.   DISCHARGE DIAGNOSES:  Intrauterine pregnancy at term in early labor,  delivered via vacuum-assisted vaginal delivery, as well as labial edema.   HISTORY OF PRESENT ILLNESS:  A 30 year old G2, P 0-0-1-0 at 58 plus 6  with an EDC of June 22 by 9-week ultrasound consistent with LMP,  presents to the MAU in early labor with cervix 1-2 cm dilated, 80%  effaced, and -2 station, very uncomfortable and admitted for pain  control and possible augmentation, progressed to 5 cm at 5:30 a.m. after  epidural and started on Pitocin to augment.  Prenatal care is uneventful  except positive group B strep.   PAST MEDICAL HISTORY:  Not significant.   PAST SURGICAL HISTORY:  Significant for D and C following a miscarriage,  tonsillectomy, and a right breast biopsy.   PAST OBSTETRICS AND GYNECOLOGIC HISTORY:  G1 was a miscarriage.  G2 is  present pregnancy.  No history of any abnormal Pap smears or sexually  transmitted diseases.   MEDICATIONS:  Prenatal vitamins.   ALLERGIES:  SHELLFISH.  However, no known drug allergies.   SOCIAL HISTORY:  The patient denies alcohol, tobacco, or drug use.   FAMILY HISTORY:  Significant for breast cancer in the mother.   HOSPITAL COURSE:  On admission, she is afebrile.  Vital signs stable and  a benign exam.  Her water was ruptured and it was clear fluid.  She was  complete and 0 station, had pushed for approximately 45 minutes with  good effort and minimal descent.  The patient rested and exaggerated  since and retried pushing  approximately an hour.  Baby had descended  after this rest.  She pushed and was in the 120s and reactive.  She  pushed very well for 2-1/2 hours with slow progress.  At this time, the  patient opted for vacuum-assisted vaginal delivery.  After risks,  benefits and alternatives were discussed, the bladder was sterilely  drained and the Kiwi was applied with 6 pulls and 2 pop-offs.  Infant  was delivered from OA to LOT.  Immediately after delivery, the baby was  depressed, NICU was called in and was at the bedside within 2 minutes of  life.  Apgars were 1, 4, and 9, weight was 7 pounds 4 ounces, infant was  delivered at 13:36.  Placenta was expressed at 13:39.  First-degree  perineal laceration was repaired with 2-0 Vicryl and it was a somewhat  stellate lesion.  EBL was less than 500 mL.  After resuscitation, the  NICU decided that infant could stay with the mother.  Her postoperative  course was uncomplicated aside from labial  edema.  Her hemoglobin was  closely followed to make sure that it was not a collecting hematoma.  Her hemoglobin decreased from 13.1 to 12.3 and was stable at 11.6 and  11.3 over the next several days.  She was discharged to home on  postpartum day #3.  At this time, she was doing well, had failed several  voiding trials, and she will be discharged with a Foley catheter.  She  is educated on this.  She will follow up in the office on Monday for  discontinuation of the Foley and another voiding trial.  She voiced  understanding to all of this and was discharged to home with routine  discharge instructions and numbers to call with any questions or  problems including prescriptions for Motrin, Percocet, Anusol, and  Macrodantin for urinary prophylaxis secondary to the Foley.  She will  also start progesterone-only pills for contraception.   DISCHARGE INFORMATION:  She is O negative, rubella immune,  breastfeeding, and she will restart her pills.  Her hemoglobin was   decreased but was stable.      Sherron Monday, MD  Electronically Signed     JB/MEDQ  D:  06/29/2009  T:  06/30/2009  Job:  [id number]

## 2011-06-21 ENCOUNTER — Encounter: Payer: Self-pay | Admitting: Family Medicine

## 2011-09-26 LAB — RH IMMUNE GLOBULIN WORKUP (NOT WOMEN'S HOSP)
ABO/RH(D): O NEG
Antibody Screen: NEGATIVE

## 2011-09-26 LAB — CBC
HCT: 32.8 — ABNORMAL LOW
Hemoglobin: 11.7 — ABNORMAL LOW
MCHC: 35.6
MCV: 88.9
Platelets: 183
RBC: 3.69 — ABNORMAL LOW
RDW: 12.9
WBC: 4.8

## 2011-10-10 ENCOUNTER — Encounter: Payer: Self-pay | Admitting: Family Medicine

## 2011-10-10 ENCOUNTER — Ambulatory Visit (INDEPENDENT_AMBULATORY_CARE_PROVIDER_SITE_OTHER): Payer: 59 | Admitting: Family Medicine

## 2011-10-10 ENCOUNTER — Ambulatory Visit: Payer: 59 | Admitting: Family Medicine

## 2011-10-10 DIAGNOSIS — J40 Bronchitis, not specified as acute or chronic: Secondary | ICD-10-CM

## 2011-10-10 MED ORDER — BENZONATATE 200 MG PO CAPS: 200.0000 mg | ORAL_CAPSULE | Freq: Three times a day (TID) | ORAL | Status: AC | PRN

## 2011-10-10 MED ORDER — AZITHROMYCIN 250 MG PO TABS: 250.0000 mg | ORAL_TABLET | Freq: Every day | ORAL | Status: AC

## 2011-10-10 NOTE — Assessment & Plan Note (Signed)
Start Zpack.  Cough meds prn.  mucinex to thin congestion.  Start OTC antihistamine to tx allergy component.  Reviewed supportive care and red flags that should prompt return.  Pt expressed understanding and is in agreement w/ plan.

## 2011-10-10 NOTE — Patient Instructions (Signed)
Take the Azithromycin as directed Drink plenty of fluids Continue the Mucinex to thin your congestion Start Claritin or Zyrtec daily Use the tessalon as needed for cough Call with any questions or concerns Hang in there!!!

## 2011-10-10 NOTE — Progress Notes (Signed)
  Subjective:    Patient ID: Felicia Rollins, female    DOB: 09-Sep-1981, 30 y.o.   MRN: [id number]  HPI Cough- sxs started 5 days ago, worse at night.  Feels 'stuck in chest'.  Cough is nonproductive.  Has chest tightness at night.  No fevers.  No ear pain, facial pain, nasal congestion.  + sick contacts.   Review of Systems For ROS see HPI     Objective:   Physical Exam  Vitals reviewed. Constitutional: She appears well-developed and well-nourished. No distress.  HENT:  Head: Normocephalic and atraumatic.       TMs normal bilaterally Mild nasal congestion Throat w/out erythema, edema, or exudate  Eyes: Conjunctivae and EOM are normal. Pupils are equal, round, and reactive to light.  Neck: Normal range of motion. Neck supple.  Cardiovascular: Normal rate, regular rhythm, normal heart sounds and intact distal pulses.   No murmur heard. Pulmonary/Chest: Effort normal and breath sounds normal. No respiratory distress. She has no wheezes.       + hacking cough  Lymphadenopathy:    She has no cervical adenopathy.          Assessment & Plan:

## 2011-11-01 ENCOUNTER — Ambulatory Visit (INDEPENDENT_AMBULATORY_CARE_PROVIDER_SITE_OTHER): Payer: 59

## 2011-11-01 DIAGNOSIS — Z23 Encounter for immunization: Secondary | ICD-10-CM

## 2011-12-30 ENCOUNTER — Encounter: Payer: Self-pay | Admitting: Family Medicine

## 2011-12-30 ENCOUNTER — Ambulatory Visit (INDEPENDENT_AMBULATORY_CARE_PROVIDER_SITE_OTHER): Payer: 59 | Admitting: Family Medicine

## 2011-12-30 VITALS — BP 105/70 | HR 76 | Temp 97.8°F | Ht 65.0 in | Wt 113.8 lb

## 2011-12-30 DIAGNOSIS — M62838 Other muscle spasm: Secondary | ICD-10-CM | POA: Insufficient documentation

## 2011-12-30 MED ORDER — CYCLOBENZAPRINE HCL 10 MG PO TABS: 10.0000 mg | ORAL_TABLET | Freq: Three times a day (TID) | ORAL | Status: DC | PRN

## 2011-12-30 MED ORDER — NAPROXEN 500 MG PO TABS: 500.0000 mg | ORAL_TABLET | Freq: Two times a day (BID) | ORAL | Status: DC

## 2011-12-30 NOTE — Patient Instructions (Signed)
This is a trapezius spasm Start the Flexeril Use a heating pad Take the Naproxen twice daily w/ food Call with any questions or concerns Hang in there! Happy Holidays!!!

## 2011-12-30 NOTE — Progress Notes (Signed)
  Subjective:    Patient ID: Felicia Rollins, female    DOB: 10/18/1981, 30 y.o.   MRN: [id number]  HPI Back spasm- hx of similar.  Recurred this weekend.  Pain is R sided, located in trap.  Pain is worse w/ extension of neck, some at extreme flexion.  No relief w/ ibuprofen or aleve.  No numbness, tingling, weakness.   Review of Systems For ROS see HPI     Objective:   Physical Exam  Vitals reviewed. Constitutional: She appears well-developed and well-nourished.  Neck:       Slightly limited flexion and extension.  Good rotation. Very tight trap spasm on R          Assessment & Plan:

## 2011-12-31 NOTE — L&D Delivery Note (Signed)
Operative Delivery Note Pt pushed for about 2 hours, able to bring vtx to +2 station, FHT with recent late decels.    At 10:10 PM a viable female was delivered via Vaginal, Vacuum Investment banker, operational).  Presentation: vertex; Position: Right,, Occiput,, Anterior; Station: +2.  Verbal consent: obtained from patient.  Risks and benefits discussed in detail.  Risks include, but are not limited to the risks of anesthesia, bleeding, infection, damage to maternal tissues, fetal cephalhematoma.  There is also the risk of inability to effect vaginal delivery of the head, or shoulder dystocia that cannot be resolved by established maneuvers, leading to the need for emergency cesarean section.  Good epidural, bladder drained of about 50 cc clear urine.  Mushroom cup applied, with 2 ctx vtx brought to +3, but vacuum would then not maintain suction.  With a couple more pushes, vtx did not move.  Bell cup applied, 3 pulls but still not good suction.  At this time I became suspicious that the expulsive effort was not adequate.  I applied fundal pressure between ctx and was able to push vtx to +4.  She was then able to push out the vertex.  There was then a minimal shoulder dystocia that resolved with McRoberts maneuver only.  APGAR: 4, 8; weight 8 lb 8.2 oz (3860 g).   Placenta status: Intact, Spontaneous.   Cord: 3 vessels with the following complications: None.  Anesthesia: Epidural  Instruments: mushroom and bell cups Episiotomy: None Lacerations:  Irregular 2nd degree Suture Repair: 3.0 vicryl rapide Est. Blood Loss (mL): 450  Mom to postpartum.  Baby to stay with mom.  Trenise Turay D 12/13/2012, 10:38 PM

## 2011-12-31 NOTE — Assessment & Plan Note (Signed)
Pt w/ hx of similar.  Start NSAIDs and muscle relaxers.  Reviewed supportive care and red flags that should prompt return.  Pt expressed understanding and is in agreement w/ plan.

## 2012-01-31 ENCOUNTER — Ambulatory Visit: Payer: 59 | Admitting: Family Medicine

## 2012-03-11 ENCOUNTER — Other Ambulatory Visit: Payer: Self-pay | Admitting: Obstetrics and Gynecology

## 2012-03-11 DIAGNOSIS — Z1231 Encounter for screening mammogram for malignant neoplasm of breast: Secondary | ICD-10-CM

## 2012-03-31 ENCOUNTER — Ambulatory Visit: Payer: 59

## 2012-04-09 ENCOUNTER — Ambulatory Visit: Payer: 59

## 2012-05-08 LAB — OB RESULTS CONSOLE HIV ANTIBODY (ROUTINE TESTING): HIV: NONREACTIVE

## 2012-05-08 LAB — OB RESULTS CONSOLE HEPATITIS B SURFACE ANTIGEN: Hepatitis B Surface Ag: NEGATIVE

## 2012-05-08 LAB — OB RESULTS CONSOLE RPR: RPR: NONREACTIVE

## 2012-05-08 LAB — OB RESULTS CONSOLE GC/CHLAMYDIA
Chlamydia: NEGATIVE
Gonorrhea: NEGATIVE

## 2012-05-08 LAB — OB RESULTS CONSOLE ABO/RH: RH Type: NEGATIVE

## 2012-05-08 LAB — OB RESULTS CONSOLE RUBELLA ANTIBODY, IGM: Rubella: IMMUNE

## 2012-10-28 LAB — HM PAP SMEAR: HM Pap smear: NORMAL

## 2012-11-16 LAB — OB RESULTS CONSOLE GBS: GBS: POSITIVE

## 2012-12-07 ENCOUNTER — Telehealth (HOSPITAL_COMMUNITY): Payer: Self-pay | Admitting: *Deleted

## 2012-12-07 ENCOUNTER — Encounter (HOSPITAL_COMMUNITY): Payer: Self-pay | Admitting: *Deleted

## 2012-12-07 NOTE — Telephone Encounter (Signed)
Preadmission screen  

## 2012-12-13 ENCOUNTER — Encounter (HOSPITAL_COMMUNITY): Payer: Self-pay | Admitting: Anesthesiology

## 2012-12-13 ENCOUNTER — Encounter (HOSPITAL_COMMUNITY): Payer: Self-pay | Admitting: *Deleted

## 2012-12-13 ENCOUNTER — Inpatient Hospital Stay (HOSPITAL_COMMUNITY): Payer: 59 | Admitting: Anesthesiology

## 2012-12-13 ENCOUNTER — Inpatient Hospital Stay (HOSPITAL_COMMUNITY)
Admission: AD | Admit: 2012-12-13 | Discharge: 2012-12-15 | DRG: 775 | Disposition: A | Discharge status: Home / Self Care | Payer: 59 | Source: Ambulatory Visit | Attending: Obstetrics and Gynecology | Admitting: Obstetrics and Gynecology

## 2012-12-13 DIAGNOSIS — Z22330 Carrier of Group B streptococcus: Secondary | ICD-10-CM

## 2012-12-13 DIAGNOSIS — O99892 Other specified diseases and conditions complicating childbirth: Secondary | ICD-10-CM | POA: Diagnosis present

## 2012-12-13 LAB — CBC
HCT: 33.4 % — ABNORMAL LOW (ref 36.0–46.0)
Hemoglobin: 11.2 g/dL — ABNORMAL LOW (ref 12.0–15.0)
MCH: 29.7 pg (ref 26.0–34.0)
MCHC: 33.5 g/dL (ref 30.0–36.0)
MCV: 88.6 fL (ref 78.0–100.0)
Platelets: 165 10*3/uL (ref 150–400)
RBC: 3.77 MIL/uL — ABNORMAL LOW (ref 3.87–5.11)
RDW: 13.7 % (ref 11.5–15.5)
WBC: 8.5 10*3/uL (ref 4.0–10.5)

## 2012-12-13 LAB — TYPE AND SCREEN
ABO/RH(D): O NEG
Antibody Screen: POSITIVE
DAT, IgG: NEGATIVE

## 2012-12-13 LAB — RPR: RPR Ser Ql: NONREACTIVE

## 2012-12-13 MED ORDER — FLEET ENEMA 7-19 GM/118ML RE ENEM: 1.0000 | ENEMA | Freq: Once | RECTAL | Status: DC

## 2012-12-13 MED ORDER — IBUPROFEN 600 MG PO TABS: 600.0000 mg | ORAL_TABLET | Freq: Four times a day (QID) | ORAL | Status: DC | PRN

## 2012-12-13 MED ORDER — OXYTOCIN 40 UNITS IN LACTATED RINGERS INFUSION - SIMPLE MED
62.5000 mL/h | INTRAVENOUS | Status: DC
  Filled 2012-12-13: qty 1000

## 2012-12-13 MED ORDER — CITRIC ACID-SODIUM CITRATE 334-500 MG/5ML PO SOLN: 30.0000 mL | ORAL | Status: DC | PRN

## 2012-12-13 MED ORDER — LIDOCAINE HCL (PF) 1 % IJ SOLN
INTRAMUSCULAR | Status: DC | PRN
  Administered 2012-12-13 (×2): 9 mL

## 2012-12-13 MED ORDER — LACTATED RINGERS IV SOLN
INTRAVENOUS | Status: DC
  Administered 2012-12-13: 21:00:00 via INTRAVENOUS

## 2012-12-13 MED ORDER — OXYCODONE-ACETAMINOPHEN 5-325 MG PO TABS: 1.0000 | ORAL_TABLET | ORAL | Status: DC | PRN

## 2012-12-13 MED ORDER — LACTATED RINGERS IV SOLN
500.0000 mL | INTRAVENOUS | Status: DC | PRN
  Administered 2012-12-13: 999 mL via INTRAVENOUS
  Administered 2012-12-13: 500 mL via INTRAVENOUS

## 2012-12-13 MED ORDER — PENICILLIN G POTASSIUM 5000000 UNITS IJ SOLR
2.5000 10*6.[IU] | INTRAMUSCULAR | Status: DC
  Administered 2012-12-13: 2.5 10*6.[IU] via INTRAVENOUS
  Filled 2012-12-13 (×6): qty 2.5

## 2012-12-13 MED ORDER — PHENYLEPHRINE 40 MCG/ML (10ML) SYRINGE FOR IV PUSH (FOR BLOOD PRESSURE SUPPORT): 80.0000 ug | PREFILLED_SYRINGE | INTRAVENOUS | Status: DC | PRN

## 2012-12-13 MED ORDER — ONDANSETRON HCL 4 MG/2ML IJ SOLN: 4.0000 mg | Freq: Four times a day (QID) | INTRAMUSCULAR | Status: DC | PRN

## 2012-12-13 MED ORDER — FENTANYL 2.5 MCG/ML BUPIVACAINE 1/10 % EPIDURAL INFUSION (WH - ANES)
14.0000 mL/h | INTRAMUSCULAR | Status: DC
  Filled 2012-12-13: qty 125

## 2012-12-13 MED ORDER — OXYTOCIN 40 UNITS IN LACTATED RINGERS INFUSION - SIMPLE MED
1.0000 m[IU]/min | INTRAVENOUS | Status: DC
  Administered 2012-12-13: 1 m[IU]/min via INTRAVENOUS

## 2012-12-13 MED ORDER — EPHEDRINE 5 MG/ML INJ: 10.0000 mg | INTRAVENOUS | Status: DC | PRN

## 2012-12-13 MED ORDER — DIPHENHYDRAMINE HCL 50 MG/ML IJ SOLN: 12.5000 mg | INTRAMUSCULAR | Status: DC | PRN

## 2012-12-13 MED ORDER — FENTANYL 2.5 MCG/ML BUPIVACAINE 1/10 % EPIDURAL INFUSION (WH - ANES)
INTRAMUSCULAR | Status: DC | PRN
  Administered 2012-12-13: 14 mL/h via EPIDURAL

## 2012-12-13 MED ORDER — ACETAMINOPHEN 325 MG PO TABS: 650.0000 mg | ORAL_TABLET | ORAL | Status: DC | PRN

## 2012-12-13 MED ORDER — TERBUTALINE SULFATE 1 MG/ML IJ SOLN: 0.2500 mg | Freq: Once | INTRAMUSCULAR | Status: AC | PRN

## 2012-12-13 MED ORDER — PENICILLIN G POTASSIUM 5000000 UNITS IJ SOLR
5.0000 10*6.[IU] | Freq: Once | INTRAVENOUS | Status: AC
  Administered 2012-12-13: 5 10*6.[IU] via INTRAVENOUS
  Filled 2012-12-13: qty 5

## 2012-12-13 MED ORDER — LACTATED RINGERS IV SOLN
500.0000 mL | Freq: Once | INTRAVENOUS | Status: AC
  Administered 2012-12-13: 500 mL via INTRAVENOUS

## 2012-12-13 MED ORDER — LIDOCAINE HCL (PF) 1 % IJ SOLN
30.0000 mL | INTRAMUSCULAR | Status: DC | PRN
  Filled 2012-12-13: qty 30

## 2012-12-13 MED ORDER — EPHEDRINE 5 MG/ML INJ
10.0000 mg | INTRAVENOUS | Status: DC | PRN
  Filled 2012-12-13: qty 4

## 2012-12-13 MED ORDER — PHENYLEPHRINE 40 MCG/ML (10ML) SYRINGE FOR IV PUSH (FOR BLOOD PRESSURE SUPPORT)
80.0000 ug | PREFILLED_SYRINGE | INTRAVENOUS | Status: DC | PRN
  Filled 2012-12-13: qty 5

## 2012-12-13 MED ORDER — OXYTOCIN BOLUS FROM INFUSION: 500.0000 mL | INTRAVENOUS | Status: DC

## 2012-12-13 NOTE — MAU Note (Signed)
Pt presents with contractions overnight increasing in intensity and frequency; q 4-5 min for last 1 hour and 20 min. Pt denies pih symptoms and srom.

## 2012-12-13 NOTE — Anesthesia Procedure Notes (Signed)
Epidural Patient location during procedure: OB Start time: 12/13/2012 4:39 PM End time: 12/13/2012 4:45 PM  Staffing Anesthesiologist: Sandrea Hughs  Preanesthetic Checklist Completed: patient identified, site marked, surgical consent, pre-op evaluation, timeout performed, IV checked, risks and benefits discussed and monitors and equipment checked  Epidural Patient position: sitting Prep: site prepped and draped and DuraPrep Patient monitoring: continuous pulse ox and blood pressure Approach: midline Injection technique: LOR air  Needle:  Needle type: Tuohy  Needle gauge: 17 G Needle length: 9 cm and 9 Needle insertion depth: 4 cm Catheter type: closed end flexible Catheter size: 19 Gauge Catheter at skin depth: 9 cm Test dose: negative and Other  Assessment Sensory level: T9 Events: blood not aspirated, injection not painful, no injection resistance, negative IV test and no paresthesia  Additional Notes Reason for block:procedure for pain

## 2012-12-13 NOTE — MAU Note (Signed)
P[t reports having ctx on and off all morning. Now about 4-5 min apart. Denies SROM or bleeding and reports good fetal movement

## 2012-12-13 NOTE — H&P (Signed)
Felicia Rollins is a 31 y.o. female, G3 P1011, EGA 40+ weeks with EDC 12-12 presenting for evaluation of regular ctx.  Eval in MAU with reg ctx, VE 4 cm dilated.  Pt was admitted and received an epidural.  She had SROM with meconium stained fluid and has continued to progress.  Prenatal care essentially uncomplicated, see prenatal records for complete history.  Maternal Medical History:  Reason for admission: Reason for admission: contractions.  Contractions: Frequency: regular.   Perceived severity is strong.    Fetal activity: Perceived fetal activity is normal.    Prenatal complications: no prenatal complications   OB History    Grav Para Term Preterm Abortions TAB SAB Ect Mult Living   3 1 1  1  1   1     SVD at term, 7 lbs 4 oz, vacuum  History reviewed. No pertinent past medical history. Past Surgical History  Procedure Date  . Breast surgery     biopsy  . Dilation and curettage of uterus   . Tonsillectomy    Family History: family history includes Aortic stenosis in her maternal grandfather; Cancer in her maternal grandmother, mother, and paternal aunt; Diabetes in her father, maternal grandfather, and paternal grandmother; Heart attack in her father and paternal grandfather; and Kidney disease in her maternal grandfather. Social History:  reports that she has never smoked. She has never used smokeless tobacco. She reports that she does not drink alcohol or use illicit drugs.   Prenatal Transfer Tool  Maternal Diabetes: No Genetic Screening: Normal Maternal Ultrasounds/Referrals: Normal Fetal Ultrasounds or other Referrals:  None Maternal Substance Abuse:  No Significant Maternal Medications:  None Significant Maternal Lab Results:  Lab values include: Group B Strep positive Other Comments:  None  Review of Systems  Respiratory: Negative.   Cardiovascular: Negative.     Dilation: 10 Effacement (%): 100 Station: +2 Exam by:: GPayne, RN Blood pressure 121/78,  pulse 89, temperature 97.3 F (36.3 C), temperature source Axillary, resp. rate 18, height 5\' 4"  (1.626 m), weight 68.856 kg (151 lb 12.8 oz), last menstrual period 03/05/2012, SpO2 98.00%. Maternal Exam:  Uterine Assessment: Contraction strength is firm.  Contraction frequency is regular.   Abdomen: Patient reports no abdominal tenderness. Estimated fetal weight is 7 lbs.   Fetal presentation: vertex  Introitus: Normal vulva. Normal vagina.  Amniotic fluid character: meconium stained.  Pelvis: adequate for delivery.   Cervix: Cervix evaluated by digital exam.     Fetal Exam Fetal Monitor Review: Mode: ultrasound.   Baseline rate: 130.  Variability: moderate (6-25 bpm).   Pattern: accelerations present and no decelerations.    Fetal State Assessment: Category I - tracings are normal.     Physical Exam  Constitutional: She appears well-developed and well-nourished.  Cardiovascular: Normal rate, regular rhythm and normal heart sounds.   No murmur heard. Respiratory: Effort normal and breath sounds normal. No respiratory distress.  GI: Soft.       gravid    Prenatal labs: ABO, Rh: --/--/O NEG (12/15 1810) Antibody: PENDING (12/15 1810) Rubella: Immune (05/10 0000) RPR: Nonreactive (05/10 0000)  HBsAg: Negative (05/10 0000)  HIV: Non-reactive (05/10 0000)  GBS: Positive (11/18 0000)  GCT:  96  Assessment/Plan: IUP at 40+ weeks in active labor, now complete and pushing.  Has received PCN for +GBS prophylaxis.  Anticipate SVD.     Felicia Rollins D 12/13/2012, 8:04 PM

## 2012-12-13 NOTE — Anesthesia Preprocedure Evaluation (Signed)
Anesthesia Evaluation  Patient identified by MRN, date of birth, ID band Patient awake    Reviewed: Allergy & Precautions, H&P , NPO status , Patient's Chart, lab work & pertinent test results  Airway Mallampati: I TM Distance: >3 FB Neck ROM: full    Dental No notable dental hx.    Pulmonary neg pulmonary ROS,    Pulmonary exam normal       Cardiovascular negative cardio ROS      Neuro/Psych negative neurological ROS  negative psych ROS   GI/Hepatic negative GI ROS, Neg liver ROS,   Endo/Other  negative endocrine ROSdiabetes  Renal/GU negative Renal ROS     Musculoskeletal negative musculoskeletal ROS (+)   Abdominal Normal abdominal exam  (+)   Peds negative pediatric ROS (+) Delivery details - Hematology negative hematology ROS (+)   Anesthesia Other Findings   Reproductive/Obstetrics (+) Pregnancy                           Anesthesia Physical Anesthesia Plan  ASA: II  Anesthesia Plan: Epidural   Post-op Pain Management:    Induction:   Airway Management Planned:   Additional Equipment:   Intra-op Plan:   Post-operative Plan:   Informed Consent: I have reviewed the patients History and Physical, chart, labs and discussed the procedure including the risks, benefits and alternatives for the proposed anesthesia with the patient or authorized representative who has indicated his/her understanding and acceptance.     Plan Discussed with:   Anesthesia Plan Comments:         Anesthesia Quick Evaluation

## 2012-12-14 MED ORDER — DIBUCAINE 1 % RE OINT
1.0000 "application " | TOPICAL_OINTMENT | RECTAL | Status: DC | PRN
  Administered 2012-12-15: 1 via RECTAL
  Filled 2012-12-14: qty 28

## 2012-12-14 MED ORDER — IBUPROFEN 600 MG PO TABS
600.0000 mg | ORAL_TABLET | Freq: Four times a day (QID) | ORAL | Status: DC
  Administered 2012-12-14 – 2012-12-15 (×8): 600 mg via ORAL
  Filled 2012-12-14 (×6): qty 1

## 2012-12-14 MED ORDER — ONDANSETRON HCL 4 MG/2ML IJ SOLN: 4.0000 mg | INTRAMUSCULAR | Status: DC | PRN

## 2012-12-14 MED ORDER — ONDANSETRON HCL 4 MG PO TABS: 4.0000 mg | ORAL_TABLET | ORAL | Status: DC | PRN

## 2012-12-14 MED ORDER — MAGNESIUM HYDROXIDE 400 MG/5ML PO SUSP: 30.0000 mL | ORAL | Status: DC | PRN

## 2012-12-14 MED ORDER — BENZOCAINE-MENTHOL 20-0.5 % EX AERO
1.0000 "application " | INHALATION_SPRAY | CUTANEOUS | Status: DC | PRN
  Administered 2012-12-14 – 2012-12-15 (×2): 1 via TOPICAL
  Filled 2012-12-14 (×3): qty 56

## 2012-12-14 MED ORDER — PRENATAL MULTIVITAMIN CH
1.0000 | ORAL_TABLET | Freq: Every day | ORAL | Status: DC
  Administered 2012-12-14: 1 via ORAL
  Filled 2012-12-14: qty 1

## 2012-12-14 MED ORDER — TETANUS-DIPHTH-ACELL PERTUSSIS 5-2.5-18.5 LF-MCG/0.5 IM SUSP: 0.5000 mL | Freq: Once | INTRAMUSCULAR | Status: DC

## 2012-12-14 MED ORDER — SENNOSIDES-DOCUSATE SODIUM 8.6-50 MG PO TABS
2.0000 | ORAL_TABLET | Freq: Every day | ORAL | Status: DC
  Administered 2012-12-14 – 2012-12-15 (×2): 2 via ORAL

## 2012-12-14 MED ORDER — MEASLES, MUMPS & RUBELLA VAC ~~LOC~~ INJ
0.5000 mL | INJECTION | Freq: Once | SUBCUTANEOUS | Status: DC
  Filled 2012-12-14: qty 0.5

## 2012-12-14 MED ORDER — WITCH HAZEL-GLYCERIN EX PADS
1.0000 "application " | MEDICATED_PAD | CUTANEOUS | Status: DC | PRN
  Administered 2012-12-14 – 2012-12-15 (×2): 1 via TOPICAL

## 2012-12-14 MED ORDER — OXYCODONE-ACETAMINOPHEN 5-325 MG PO TABS
1.0000 | ORAL_TABLET | ORAL | Status: DC | PRN
  Filled 2012-12-14: qty 1

## 2012-12-14 MED ORDER — ZOLPIDEM TARTRATE 5 MG PO TABS: 5.0000 mg | ORAL_TABLET | Freq: Every evening | ORAL | Status: DC | PRN

## 2012-12-14 MED ORDER — METHYLERGONOVINE MALEATE 0.2 MG PO TABS: 0.2000 mg | ORAL_TABLET | ORAL | Status: DC | PRN

## 2012-12-14 MED ORDER — SIMETHICONE 80 MG PO CHEW: 80.0000 mg | CHEWABLE_TABLET | ORAL | Status: DC | PRN

## 2012-12-14 MED ORDER — DIPHENHYDRAMINE HCL 25 MG PO CAPS: 25.0000 mg | ORAL_CAPSULE | Freq: Four times a day (QID) | ORAL | Status: DC | PRN

## 2012-12-14 MED ORDER — METHYLERGONOVINE MALEATE 0.2 MG/ML IJ SOLN: 0.2000 mg | INTRAMUSCULAR | Status: DC | PRN

## 2012-12-14 MED ORDER — LANOLIN HYDROUS EX OINT: TOPICAL_OINTMENT | CUTANEOUS | Status: DC | PRN

## 2012-12-14 NOTE — Progress Notes (Signed)
PPD #1 Having trouble voiding, pain ok Afeb, VSS Fundus firm, NT at U+2 and to left Continue routine postpartum care, will place foley today due to difficulty voiding and vulvar edema.  Remove foley tomorrow am and see how she does.

## 2012-12-14 NOTE — Anesthesia Postprocedure Evaluation (Signed)
  Anesthesia Post-op Note  Patient: Felicia Rollins  Procedure(s) Performed: * No procedures listed *  Patient Location: Mother/Baby  Anesthesia Type:Epidural  Level of Consciousness: awake, alert  and oriented  Airway and Oxygen Therapy: Patient Spontanous Breathing  Post-op Pain: none  Post-op Assessment: Post-op Vital signs reviewed and Patient's Cardiovascular Status Stable  Post-op Vital Signs: Reviewed and stable  Complications: No apparent anesthesia complications

## 2012-12-15 ENCOUNTER — Inpatient Hospital Stay (HOSPITAL_COMMUNITY): Admission: RE | Admit: 2012-12-15 | Payer: 59 | Source: Ambulatory Visit

## 2012-12-15 LAB — CBC WITH DIFFERENTIAL/PLATELET
Band Neutrophils: 0 % (ref 0–10)
Basophils Absolute: 0 10*3/uL (ref 0.0–0.1)
Basophils Relative: 0 % (ref 0–1)
Blasts: 0 %
Eosinophils Absolute: 0.1 10*3/uL (ref 0.0–0.7)
Eosinophils Relative: 1 % (ref 0–5)
HCT: 29.5 % — ABNORMAL LOW (ref 36.0–46.0)
Hemoglobin: 9.8 g/dL — ABNORMAL LOW (ref 12.0–15.0)
Lymphocytes Relative: 20 % (ref 12–46)
Lymphs Abs: 2 10*3/uL (ref 0.7–4.0)
MCH: 30.2 pg (ref 26.0–34.0)
MCHC: 33.2 g/dL (ref 30.0–36.0)
MCV: 90.8 fL (ref 78.0–100.0)
Metamyelocytes Relative: 1 %
Monocytes Absolute: 0.2 10*3/uL (ref 0.1–1.0)
Monocytes Relative: 2 % — ABNORMAL LOW (ref 3–12)
Myelocytes: 0 %
Neutro Abs: 7.6 10*3/uL (ref 1.7–7.7)
Neutrophils Relative %: 76 % (ref 43–77)
Platelets: 156 10*3/uL (ref 150–400)
Promyelocytes Absolute: 0 %
RBC: 3.25 MIL/uL — ABNORMAL LOW (ref 3.87–5.11)
RDW: 14.1 % (ref 11.5–15.5)
WBC: 9.9 10*3/uL (ref 4.0–10.5)
nRBC: 0 /100 WBC

## 2012-12-15 MED ORDER — RHO D IMMUNE GLOBULIN 1500 UNIT/2ML IJ SOLN
300.0000 ug | Freq: Once | INTRAMUSCULAR | Status: AC
  Administered 2012-12-15: 300 ug via INTRAMUSCULAR
  Filled 2012-12-15: qty 2

## 2012-12-15 NOTE — Discharge Summary (Signed)
Obstetric Discharge Summary Reason for Admission: onset of labor Prenatal Procedures: none Intrapartum Procedures: vacuum Postpartum Procedures: foley for urinary retention Complications-Operative and Postpartum: 2nd degree perineal laceration Hemoglobin  Date Value Range Status  12/15/2012 9.8* 12.0 - 15.0 g/dL Final     HCT  Date Value Range Status  12/15/2012 29.5* 36.0 - 46.0 % Final    Physical Exam:  General: alert Lochia: appropriate Uterine Fundus: firm  Discharge Diagnoses: Term Pregnancy-delivered and postpartum urinary retention  Discharge Information: Date: 12/15/2012 Activity: pelvic rest Diet: routine Medications: Ibuprofen Condition: stable Instructions: refer to practice specific booklet Discharge to: home Follow-up Information    Follow up with Aliceson Dolbow D, MD. Schedule an appointment as soon as possible for a visit in 6 weeks.   Contact information:   [address] [place] [address] [phone]          Newborn Data: Live born female  Birth Weight: 8 lb 8.2 oz (3861 g) APGAR: 4, 8  Home with mother.  Allisson Schindel D 12/15/2012, 8:52 AM

## 2012-12-15 NOTE — Progress Notes (Signed)
Inability to void, attempted multiple times. Edema to peri area, hemmroids. F/c inserted, discharge teaching on foley care. Will follow-up Thursday.

## 2012-12-15 NOTE — Plan of Care (Signed)
Problem: Phase I Progression Outcomes Goal: Voiding adequately Outcome: Not Met (add Reason) Will go home with foley

## 2012-12-15 NOTE — Progress Notes (Signed)
PPD #2 Doing well Afeb, VSS Will d/c foley and see if able to void.  If unable to void will d/c home with foley for a couple of days

## 2012-12-16 LAB — RH IG WORKUP (INCLUDES ABO/RH)
ABO/RH(D): O NEG
Fetal Screen: NEGATIVE
Gestational Age(Wks): 40.3
Unit division: 0

## 2013-08-06 ENCOUNTER — Ambulatory Visit: Payer: 59 | Admitting: Internal Medicine

## 2014-03-28 ENCOUNTER — Ambulatory Visit (INDEPENDENT_AMBULATORY_CARE_PROVIDER_SITE_OTHER): Payer: 59 | Admitting: Physician Assistant

## 2014-03-28 ENCOUNTER — Encounter: Payer: Self-pay | Admitting: Physician Assistant

## 2014-03-28 VITALS — BP 103/65 | HR 83 | Temp 98.5°F | Resp 14 | Ht 64.0 in | Wt 113.0 lb

## 2014-03-28 DIAGNOSIS — H01009 Unspecified blepharitis unspecified eye, unspecified eyelid: Secondary | ICD-10-CM

## 2014-03-28 DIAGNOSIS — H01003 Unspecified blepharitis right eye, unspecified eyelid: Secondary | ICD-10-CM

## 2014-03-28 MED ORDER — AZITHROMYCIN 1 % OP SOLN: OPHTHALMIC | Status: DC

## 2014-03-28 NOTE — Assessment & Plan Note (Signed)
Rx Azithromycin OP.  Warm compresses.  Hygiene measures discussed.  Claritin or Benadryl daily.

## 2014-03-28 NOTE — Progress Notes (Signed)
Pre visit review using our clinic review tool, if applicable. No additional management support is needed unless otherwise documented below in the visit note/SLS  

## 2014-03-28 NOTE — Patient Instructions (Signed)
Apply warm compresses to affected eye several times per day.  Take a daily claritin or benadryl to help with itch/swelling.  Use antibiotic drop as directed for 2 weeks.  Call or return to clinic if symptoms are not improving, or if they acutely worsen.

## 2014-03-28 NOTE — Progress Notes (Signed)
Patient presents to clinic today c/o swelling, drainage and tenderness from the Right upper eyelid x 1.5 days.  Patient denies vision changes.  Denies reddened conjunctiva.  Patient denies new pet.  Denies trauma or injury.  Denies sick contact.  No past medical history on file.  No current outpatient prescriptions on file prior to visit.   No current facility-administered medications on file prior to visit.    Allergies  Allergen Reactions  . Shellfish Allergy     Per allergy testing    Family History  Problem Relation Age of Onset  . Cancer Mother     breast  . Diabetes Father   . Heart attack Father   . Cancer Maternal Grandmother     liver  . Aortic stenosis Maternal Grandfather   . Diabetes Maternal Grandfather   . Kidney disease Maternal Grandfather     failure  . Diabetes Paternal Grandmother   . Heart attack Paternal Grandfather   . Cancer Paternal Aunt     breast    History   Social History  . Marital Status: Married    Spouse Name: N/A    Number of Children: N/A  . Years of Education: N/A   Social History Main Topics  . Smoking status: Never Smoker   . Smokeless tobacco: Never Used  . Alcohol Use: No  . Drug Use: No  . Sexual Activity: Yes   Other Topics Concern  . None   Social History Narrative  . None   Review of Systems - See HPI.  All other ROS are negative.  BP 103/65  Pulse 83  Temp(Src) 98.5 F (36.9 C) (Oral)  Resp 14  Ht 5\' 4"  (1.626 m)  Wt 113 lb (51.256 kg)  BMI 19.39 kg/m2  SpO2 98%  LMP 02/27/2014  Physical Exam  Vitals reviewed. Constitutional: She is oriented to person, place, and time and well-developed, well-nourished, and in no distress.  HENT:  Head: Normocephalic and atraumatic.  Right Ear: External ear normal.  Left Ear: External ear normal.  Nose: Nose normal.  Mouth/Throat: Oropharynx is clear and moist. No oropharyngeal exudate.  Eyes: Conjunctivae are normal. Pupils are equal, round, and reactive to light.   Right upper eyelid erythematous, swollen and tender.  No evidence of conjunctivitis.  Neck: Neck supple.  Cardiovascular: Normal rate, regular rhythm, normal heart sounds and intact distal pulses.   Pulmonary/Chest: Effort normal and breath sounds normal. No respiratory distress. She has no wheezes. She has no rales. She exhibits no tenderness.  Lymphadenopathy:    She has no cervical adenopathy.  Neurological: She is alert and oriented to person, place, and time.  Skin: Skin is warm and dry. No rash noted.  Psychiatric: Affect normal.   Assessment/Plan: Blepharitis of right eye Rx Azithromycin OP.  Warm compresses.  Hygiene measures discussed.  Claritin or Benadryl daily.

## 2014-03-29 ENCOUNTER — Ambulatory Visit: Payer: 59 | Admitting: Internal Medicine

## 2014-06-28 ENCOUNTER — Other Ambulatory Visit: Payer: Self-pay

## 2014-06-28 DIAGNOSIS — Z1231 Encounter for screening mammogram for malignant neoplasm of breast: Secondary | ICD-10-CM

## 2014-07-06 ENCOUNTER — Ambulatory Visit: Payer: 59 | Admitting: Internal Medicine

## 2014-07-11 ENCOUNTER — Ambulatory Visit (INDEPENDENT_AMBULATORY_CARE_PROVIDER_SITE_OTHER): Payer: 59 | Admitting: Family Medicine

## 2014-07-11 ENCOUNTER — Encounter: Payer: Self-pay | Admitting: Family Medicine

## 2014-07-11 VITALS — BP 112/78 | HR 84 | Temp 98.0°F | Resp 16 | Wt 112.0 lb

## 2014-07-11 DIAGNOSIS — M62838 Other muscle spasm: Secondary | ICD-10-CM

## 2014-07-11 MED ORDER — MELOXICAM 15 MG PO TABS: 15.0000 mg | ORAL_TABLET | Freq: Every day | ORAL | Status: DC

## 2014-07-11 MED ORDER — CYCLOBENZAPRINE HCL 10 MG PO TABS: 10.0000 mg | ORAL_TABLET | Freq: Three times a day (TID) | ORAL | Status: DC | PRN

## 2014-07-11 NOTE — Progress Notes (Signed)
   Subjective:    Patient ID: Felicia Rollins, female    DOB: 11/26/1981, 33 y.o.   MRN: [id number]  Back Pain   Muscle spasm- sxs started Wednesday, localized to upper back.  Has an 3318 month old and does a lot of bending and lifting.  sxs somewhat improved w/ Ibuprofen.  Hx of similar.  Has tried heating pad w/o relief.  Will have increased tightness w/ activity.  No numbness/weakness.   Review of Systems  Musculoskeletal: Positive for back pain.   For ROS see HPI     Objective:   Physical Exam  Vitals reviewed. Constitutional: She is oriented to person, place, and time. She appears well-developed and well-nourished. No distress.  HENT:  Head: Normocephalic and atraumatic.  Neck: Neck supple. No thyromegaly present.  R Trap spasm  Cardiovascular: Intact distal pulses.   Lymphadenopathy:    She has no cervical adenopathy.  Neurological: She is alert and oriented to person, place, and time. No cranial nerve deficit.          Assessment & Plan:

## 2014-07-11 NOTE — Progress Notes (Signed)
Pre visit review using our clinic review tool, if applicable. No additional management support is needed unless otherwise documented below in the visit note. 

## 2014-07-11 NOTE — Assessment & Plan Note (Signed)
Recurrent.  Start flexeril prn.  Daily NSAID until pain resolves.  Heat.  Reviewed supportive care and red flags that should prompt return.  Pt expressed understanding and is in agreement w/ plan.

## 2014-07-11 NOTE — Patient Instructions (Signed)
Schedule your physical at your convenience Start the daily anti-inflammatory (Mobic) for the next 3-5 days (take w/ food) Use the flexeril only as needed for spasm HEAT! Do some gentle stretching to increase range of motion Call with any questions or concerns Hang in there! Happy [birth year]ated Birthday!!!

## 2014-07-14 ENCOUNTER — Ambulatory Visit
Admission: RE | Admit: 2014-07-14 | Discharge: 2014-07-14 | Disposition: A | Discharge status: Home / Self Care | Payer: 59 | Source: Ambulatory Visit

## 2014-07-14 DIAGNOSIS — Z1231 Encounter for screening mammogram for malignant neoplasm of breast: Secondary | ICD-10-CM

## 2014-08-22 ENCOUNTER — Ambulatory Visit (HOSPITAL_BASED_OUTPATIENT_CLINIC_OR_DEPARTMENT_OTHER)
Admission: RE | Admit: 2014-08-22 | Discharge: 2014-08-22 | Disposition: A | Discharge status: Home / Self Care | Payer: 59 | Source: Ambulatory Visit | Attending: Internal Medicine | Admitting: Internal Medicine

## 2014-08-22 ENCOUNTER — Telehealth: Payer: Self-pay | Admitting: Family Medicine

## 2014-08-22 ENCOUNTER — Ambulatory Visit (INDEPENDENT_AMBULATORY_CARE_PROVIDER_SITE_OTHER): Payer: 59 | Admitting: Internal Medicine

## 2014-08-22 ENCOUNTER — Encounter: Payer: Self-pay | Admitting: Internal Medicine

## 2014-08-22 VITALS — BP 102/58 | HR 79 | Temp 98.4°F | Wt 111.2 lb

## 2014-08-22 DIAGNOSIS — S6992XA Unspecified injury of left wrist, hand and finger(s), initial encounter: Secondary | ICD-10-CM

## 2014-08-22 DIAGNOSIS — M7989 Other specified soft tissue disorders: Secondary | ICD-10-CM | POA: Insufficient documentation

## 2014-08-22 DIAGNOSIS — S6990XA Unspecified injury of unspecified wrist, hand and finger(s), initial encounter: Secondary | ICD-10-CM | POA: Diagnosis not present

## 2014-08-22 DIAGNOSIS — S6980XA Other specified injuries of unspecified wrist, hand and finger(s), initial encounter: Secondary | ICD-10-CM

## 2014-08-22 DIAGNOSIS — X58XXXA Exposure to other specified factors, initial encounter: Secondary | ICD-10-CM | POA: Insufficient documentation

## 2014-08-22 DIAGNOSIS — R209 Unspecified disturbances of skin sensation: Secondary | ICD-10-CM | POA: Diagnosis not present

## 2014-08-22 NOTE — Progress Notes (Signed)
   Subjective:    Patient ID: Felicia Rollins, female    DOB: Jun 26, 1981, 33 y.o.   MRN: [id number]  DOS:  08/22/2014 Type of visit - description: acute History: 2 days ago, she  was lifting her child from the car seat, her wedding ring got stuck while she was pulling her hand, immediately developed pain and mild swelling of the finger and numbness of the whole palmar  aspect of the finger   ROS No other injuries, no bleeding  History reviewed. No pertinent past medical history.  Past Surgical History  Procedure Laterality Date  . Breast surgery      biopsy  . Dilation and curettage of uterus    . Tonsillectomy      History   Social History  . Marital Status: Married    Spouse Name: N/A    Number of Children: N/A  . Years of Education: N/A   Occupational History  . Not on file.   Social History Main Topics  . Smoking status: Never Smoker   . Smokeless tobacco: Never Used  . Alcohol Use: No  . Drug Use: No  . Sexual Activity: Yes   Other Topics Concern  . Not on file   Social History Narrative  . No narrative on file        Medication List    Notice As of 08/22/2014  1:45 PM   You have not been prescribed any medications.         Objective:   Physical Exam BP 102/58  Pulse 79  Temp(Src) 98.4 F (36.9 C) (Oral)  Wt 111 lb 4 oz (50.463 kg)  SpO2 100%  LMP 08/15/2014  Breastfeeding? No General -- alert, well-developed, NAD.   Extremities-- no pretibial edema bilaterally   R hand normal L hand normal Except for mild swelling of the proximal ring finger. Ring finger on the left without deformities, motor is normal, vascular is normal. No deformities, Neurologic--  alert & oriented X3. Speech normal, gait appropriate for age, strength symmetric and appropriate for age.  Psych-- Cognition and judgment appear intact. Cooperative with normal attention span and concentration. No anxious or depressed appearing.      Assessment & Plan:   Injury, ring  finger on the left.  has mild swelling and no obvious deformity or fracture however she is having numbness and the palmar aspect of the finger. This could be related to nerve damage at the time of the injury or swelling. Plan: X-ray,motrin,  hand surgery referral ( I like to be sure no intervention is needed to prevent the numbness to be permanent)

## 2014-08-22 NOTE — Progress Notes (Signed)
Pre-visit discussion using our clinic review tool. No additional management support is needed unless otherwise documented below in the visit note.  

## 2014-08-22 NOTE — Telephone Encounter (Signed)
Caller name: Ailah  Relation to pt: self  Call back number: [phone]   Reason for call: injured ring finger wanted to make you aware pt scheduled an appointment with Dr. Drue Novel today.

## 2014-08-22 NOTE — Telephone Encounter (Signed)
Noted  

## 2014-08-22 NOTE — Patient Instructions (Signed)
Get the XR at [facility] [place], corner of [address] (10 minutes form here); they are open 24/7 [address]  [place], [place] [address] [phone]   Motrin 200 mg 2 tablets every 6 hours as needed for pain. Always take it with food because may cause gastritis and ulcers. If you notice nausea, stomach pain, change in the color of stools --->  Stop the medicine and let us know  

## 2014-08-23 ENCOUNTER — Telehealth: Payer: Self-pay | Admitting: Internal Medicine

## 2014-08-23 NOTE — Telephone Encounter (Signed)
Spoke with Pt about lab results, and referral was placed to Ortho this morning at 0750.

## 2014-08-23 NOTE — Telephone Encounter (Signed)
Caller name: Nakiah  Relation to pt: self  Call back number: [phone]   Reason for call:   pt was seen in office 08/22/14 due to an injured finger pt would like a f/u call regarding x-rays taken at the med center 08/22/14 and would like a referral to hand doctor.

## 2014-08-24 ENCOUNTER — Telehealth: Payer: Self-pay

## 2014-08-24 NOTE — Telephone Encounter (Signed)
Spoke with Pt, she has appt with [facility] today.

## 2014-08-24 NOTE — Telephone Encounter (Signed)
Message sent to [facility] about referral.

## 2014-08-24 NOTE — Telephone Encounter (Signed)
Felicia Rollins [phone]  [name] called and she now wants her referral to be with Dr Dorthula Matas with [facility] [phone] and she would like appointment today if possible since she is dealing with nerve

## 2014-10-21 ENCOUNTER — Encounter: Payer: Self-pay | Admitting: Family Medicine

## 2014-10-21 ENCOUNTER — Ambulatory Visit (INDEPENDENT_AMBULATORY_CARE_PROVIDER_SITE_OTHER): Payer: 59 | Admitting: Family Medicine

## 2014-10-21 DIAGNOSIS — M62838 Other muscle spasm: Secondary | ICD-10-CM

## 2014-10-21 MED ORDER — MELOXICAM 15 MG PO TABS: 15.0000 mg | ORAL_TABLET | Freq: Every day | ORAL | Status: DC

## 2014-10-21 MED ORDER — CYCLOBENZAPRINE HCL 10 MG PO TABS: 10.0000 mg | ORAL_TABLET | Freq: Three times a day (TID) | ORAL | Status: DC | PRN

## 2014-10-21 NOTE — Assessment & Plan Note (Signed)
New.  Pt w/ muscle spasm and inflammation but no bony abnormality.  Reviewed supportive care and red flags that should prompt return.  Pt expressed understanding and is in agreement w/ plan.

## 2014-10-21 NOTE — Patient Instructions (Signed)
Follow up as scheduled This is all likely inflammation and muscle spasm- no evidence of bony abnormality Mobic x5-7 days w/ food for inflammation Flexeril at night for spasm- will cause drowsiness HEAT! Call with any questions or concerns Hang in there!!
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
## 2014-10-21 NOTE — Assessment & Plan Note (Signed)
New.  Start scheduled NSAIDs, flexeril prn.  Heat.  Reviewed supportive care and red flags that should prompt return.  Pt expressed understanding and is in agreement w/ plan.

## 2014-10-21 NOTE — Progress Notes (Signed)
Pre visit review using our clinic review tool, if applicable. No additional management support is needed unless otherwise documented below in the visit note. 

## 2014-10-28 ENCOUNTER — Encounter: Payer: Self-pay | Admitting: Family Medicine

## 2014-10-28 ENCOUNTER — Ambulatory Visit (INDEPENDENT_AMBULATORY_CARE_PROVIDER_SITE_OTHER): Payer: 59 | Admitting: Family Medicine

## 2014-10-28 VITALS — BP 106/78 | HR 100 | Temp 98.1°F | Resp 16 | Ht 64.25 in | Wt 110.1 lb

## 2014-10-28 DIAGNOSIS — Z0000 Encounter for general adult medical examination without abnormal findings: Secondary | ICD-10-CM

## 2014-10-28 LAB — BASIC METABOLIC PANEL
BUN: 14 mg/dL (ref 6–23)
CO2: 26 mEq/L (ref 19–32)
Calcium: 9.4 mg/dL (ref 8.4–10.5)
Chloride: 104 mEq/L (ref 96–112)
Creatinine, Ser: 0.7 mg/dL (ref 0.4–1.2)
GFR: 96.25 mL/min (ref >60.00)
Glucose, Bld: 76 mg/dL (ref 70–99)
Potassium: 4 mEq/L (ref 3.5–5.1)
Sodium: 136 mEq/L (ref 135–145)

## 2014-10-28 LAB — CBC WITH DIFFERENTIAL/PLATELET
Basophils Absolute: 0 10*3/uL (ref 0.0–0.1)
Basophils Relative: 0.5 % (ref 0.0–3.0)
Eosinophils Absolute: 0.1 10*3/uL (ref 0.0–0.7)
Eosinophils Relative: 0.9 % (ref 0.0–5.0)
HCT: 37.5 % (ref 36.0–46.0)
Hemoglobin: 12.3 g/dL (ref 12.0–15.0)
Lymphocytes Relative: 17.5 % (ref 12.0–46.0)
Lymphs Abs: 1.4 10*3/uL (ref 0.7–4.0)
MCHC: 32.7 g/dL (ref 30.0–36.0)
MCV: 85.9 fl (ref 78.0–100.0)
Monocytes Absolute: 0.4 10*3/uL (ref 0.1–1.0)
Monocytes Relative: 4.7 % (ref 3.0–12.0)
Neutro Abs: 6.2 10*3/uL (ref 1.4–7.7)
Neutrophils Relative %: 76.4 % (ref 43.0–77.0)
Platelets: 224 10*3/uL (ref 150.0–400.0)
RBC: 4.37 Mil/uL (ref 3.87–5.11)
RDW: 13.7 % (ref 11.5–15.5)
WBC: 8.1 10*3/uL (ref 4.0–10.5)

## 2014-10-28 LAB — HEPATIC FUNCTION PANEL
ALT: 13 U/L (ref 0–35)
AST: 18 U/L (ref 0–37)
Albumin: 4.2 g/dL (ref 3.5–5.2)
Alkaline Phosphatase: 35 U/L — ABNORMAL LOW (ref 39–117)
Bilirubin, Direct: 0 mg/dL (ref 0.0–0.3)
Total Bilirubin: 0.7 mg/dL (ref 0.2–1.2)
Total Protein: 7.9 g/dL (ref 6.0–8.3)

## 2014-10-28 LAB — LIPID PANEL
Cholesterol: 180 mg/dL (ref 0–200)
HDL: 36.8 mg/dL — ABNORMAL LOW (ref >39.00)
LDL Cholesterol: 122 mg/dL — ABNORMAL HIGH (ref 0–99)
NonHDL: 143.2
Total CHOL/HDL Ratio: 5
Triglycerides: 106 mg/dL (ref 0.0–149.0)
VLDL: 21.2 mg/dL (ref 0.0–40.0)

## 2014-10-28 LAB — TSH: TSH: 1.53 u[IU]/mL (ref 0.35–4.50)

## 2014-10-28 LAB — VITAMIN D 25 HYDROXY (VIT D DEFICIENCY, FRACTURES): VITD: 25.75 ng/mL — ABNORMAL LOW (ref 30.00–100.00)

## 2014-10-28 NOTE — Progress Notes (Signed)
Pre visit review using our clinic review tool, if applicable. No additional management support is needed unless otherwise documented below in the visit note. 

## 2014-10-28 NOTE — Patient Instructions (Signed)
Follow up in 1 year or as needed We'll notify you of your lab results and make any changes if needed Start a once daily multivitamin and add Ca+D supplement Call with any questions or concerns Keep up the good work!  You look great! Happy Holidays!!!

## 2014-10-28 NOTE — Progress Notes (Signed)
   Subjective:    Patient ID: Felicia Rollins, female    DOB: 06/12/1981, 33 y.o.   MRN: [id number]  HPI CPE- pt has GYN.  No concerns.   Review of Systems Patient reports no vision/ hearing changes, adenopathy,fever, weight change,  persistant/recurrent hoarseness , swallowing issues, chest pain, palpitations, edema, persistant/recurrent cough, hemoptysis, dyspnea (rest/exertional/paroxysmal nocturnal), gastrointestinal bleeding (melena, rectal bleeding), abdominal pain, significant heartburn, bowel changes, GU symptoms (dysuria, hematuria, incontinence), Gyn symptoms (abnormal  bleeding, pain),  syncope, focal weakness, memory loss, numbness & tingling, skin/hair/nail changes, abnormal bruising or bleeding, anxiety, or depression.     Objective:   Physical Exam General Appearance:    Alert, cooperative, no distress, appears stated age  Head:    Normocephalic, without obvious abnormality, atraumatic  Eyes:    PERRL, conjunctiva/corneas clear, EOM's intact, fundi    benign, both eyes  Ears:    Normal TM's and external ear canals, both ears  Nose:   Nares normal, septum midline, mucosa normal, no drainage    or sinus tenderness  Throat:   Lips, mucosa, and tongue normal; teeth and gums normal  Neck:   Supple, symmetrical, trachea midline, no adenopathy;    Thyroid: no enlargement/tenderness/nodules  Back:     Symmetric, no curvature, ROM normal, no CVA tenderness  Lungs:     Clear to auscultation bilaterally, respirations unlabored  Chest Wall:    No tenderness or deformity   Heart:    Regular rate and rhythm, S1 and S2 normal, no murmur, rub   or gallop  Breast Exam:    Deferred to GYN  Abdomen:     Soft, non-tender, bowel sounds active all four quadrants,    no masses, no organomegaly  Genitalia:    Deferred to GYN  Rectal:    Extremities:   Extremities normal, atraumatic, no cyanosis or edema  Pulses:   2+ and symmetric all extremities  Skin:   Skin color, texture, turgor normal,  no rashes or lesions  Lymph nodes:   Cervical, supraclavicular, and axillary nodes normal  Neurologic:   CNII-XII intact, normal strength, sensation and reflexes    throughout          Assessment & Plan:

## 2014-10-28 NOTE — Assessment & Plan Note (Signed)
Pt's PE WNL.  UTD on GYN.  Check labs.  Anticipatory guidance provided.  

## 2014-10-31 ENCOUNTER — Encounter: Payer: Self-pay | Admitting: General Practice

## 2014-10-31 ENCOUNTER — Encounter: Payer: Self-pay | Admitting: Family Medicine

## 2014-10-31 ENCOUNTER — Other Ambulatory Visit: Payer: Self-pay | Admitting: General Practice

## 2014-10-31 MED ORDER — VITAMIN D (ERGOCALCIFEROL) 1.25 MG (50000 UNIT) PO CAPS: 50000.0000 [IU] | ORAL_CAPSULE | ORAL | Status: DC

## 2014-11-04 ENCOUNTER — Telehealth: Payer: Self-pay | Admitting: Family Medicine

## 2014-11-04 NOTE — Telephone Encounter (Signed)
Pt notified of how to take medications.

## 2014-11-04 NOTE — Telephone Encounter (Signed)
Caller name:  Starleen ArmsMaturino, Shalan N Relation to pt: self  Call back number: [phone]   Reason for call:  Pt wanted to know if she picked up the right OTC medication D3-1000 IU as per providers advice and should she take the prescribed Vitamin D, Ergocalciferol, (DRISDOL) 50000 UNITS CAPS capsule at the same time please advise and  leave detailed message

## 2014-11-28 ENCOUNTER — Other Ambulatory Visit: Payer: Self-pay | Admitting: Family Medicine

## 2014-11-28 NOTE — Telephone Encounter (Signed)
Pt needs to only be on OTC med.

## 2015-01-02 ENCOUNTER — Telehealth: Payer: Self-pay | Admitting: Family Medicine

## 2015-01-02 MED ORDER — VITAMIN D (ERGOCALCIFEROL) 1.25 MG (50000 UNIT) PO CAPS: 50000.0000 [IU] | ORAL_CAPSULE | ORAL | Status: DC

## 2015-01-02 NOTE — Telephone Encounter (Signed)
Patient also wants to know what dosage of OTC vit D should she be taking.

## 2015-01-02 NOTE — Telephone Encounter (Signed)
Spoke with pharmacy and pt had only received 38 not full 12 week rx. New prescription sent for the remaining 4 weeks.

## 2015-01-02 NOTE — Telephone Encounter (Signed)
Pt does not need to be on this medication. Should only be on 3,000 units daily.

## 2015-01-02 NOTE — Addendum Note (Signed)
Addended by: Jackson Latino on: 01/02/2015 12:02 PM   Modules accepted: Orders

## 2015-01-02 NOTE — Telephone Encounter (Signed)
Caller name: Chesnee Relation to pt: self Call back number: [phone] Pharmacy: [facility] on [address]  Reason for call:   Patient states that she was told to take 12 weeks of this medication. She says that she has only taken 8 weeks. Does she need to take the other four weeks? Patient also wants to know if she needs labs to check Vit D level again.

## 2015-06-06 ENCOUNTER — Encounter: Payer: Self-pay | Admitting: Internal Medicine

## 2015-06-06 ENCOUNTER — Ambulatory Visit (INDEPENDENT_AMBULATORY_CARE_PROVIDER_SITE_OTHER): Payer: 59 | Admitting: Internal Medicine

## 2015-06-06 VITALS — BP 102/64 | HR 84 | Temp 97.8°F | Ht 64.25 in | Wt 110.0 lb

## 2015-06-06 DIAGNOSIS — H578 Other specified disorders of eye and adnexa: Secondary | ICD-10-CM | POA: Diagnosis not present

## 2015-06-06 DIAGNOSIS — H5789 Other specified disorders of eye and adnexa: Secondary | ICD-10-CM

## 2015-06-06 MED ORDER — AZITHROMYCIN 1 % OP SOLN: 1.0000 [drp] | Freq: Two times a day (BID) | OPHTHALMIC | Status: DC

## 2015-06-06 NOTE — Progress Notes (Signed)
Subjective:    Patient ID: Felicia BlanchEleina N Garcia, female    DOB: 02/09/1981, 34 y.o.   MRN: [id number]  DOS:  06/06/2015 Type of visit - description : acute visit Interval history:  Patient is a 34 year old female with a history of R eye blepharitis one year ago here today c/o 2 days of L eye redness following a night where she slept in her contact lenses. Redness is accompanied by tearing and foreign body sensation. She denies vision changes, eye pain/itching, photophobia, or any purulent eye discharge. She has applied Vysine with no relief. Denies headaches and nausea/vomiting. She notes she is able to open and close her eyes voluntarily.  Review of Systems  Constitutional: No fever. No chills.   HEENT: As per HPI  Respiratory: No difficulty breathing.  Cardiovascular: No CP  GI: no nausea, no vomiting  GU: No dysuria, gross hematuria, difficulty urinating.  Neurological: No dizziness or diplopia.     History reviewed. No pertinent past medical history.  Past Surgical History  Procedure Laterality Date  . Breast surgery      biopsy  . Dilation and curettage of uterus    . Tonsillectomy      History   Social History  . Marital Status: Married    Spouse Name: N/A  . Number of Children: N/A  . Years of Education: N/A   Occupational History  . Not on file.   Social History Main Topics  . Smoking status: Never Smoker   . Smokeless tobacco: Never Used  . Alcohol Use: No  . Drug Use: No  . Sexual Activity: Yes   Other Topics Concern  . Not on file   Social History Narrative     Family History  Problem Relation Age of Onset  . Cancer Mother     breast  . Diabetes Father   . Heart attack Father   . Cancer Maternal Grandmother     liver  . Aortic stenosis Maternal Grandfather   . Diabetes Maternal Grandfather   . Kidney disease Maternal Grandfather     failure  . Diabetes Paternal Grandmother   . Heart attack Paternal Grandfather   . Cancer Paternal  Aunt     breast      Medication List       This list is accurate as of: 06/06/15 11:59 PM.  Always use your most recent med list.               azithromycin 1 % ophthalmic solution  Commonly known as:  AZASITE  Place 1 drop into the left eye 2 (two) times daily.     Vitamin D3 3000 UNITS Tabs  Take 1 tablet by mouth daily.           Objective:   Physical Exam BP 102/64 mmHg  Pulse 84  Temp(Src) 97.8 F (36.6 C) (Oral)  Ht 5' 4.25" (1.632 m)  Wt 110 lb (49.896 kg)  BMI 18.73 kg/m2  SpO2 98%  LMP 05/22/2015 (Approximate)  General:   Well developed, well nourished . NAD.  HEENT:  Normocephalic . Face symmetric, atraumatic Medial aspect of left eye is erythematous. No erythema of eyelid noted.  Fluorescein examination showed no foreign bodies or corneal abnormalities Pupils equal and reactive, anterior chambers normal. Lungs:  CTA B Normal respiratory effort, no intercostal retractions, no accessory muscle use. Heart: RRR,  no murmur.  Skin: Not pale. Not jaundice Neurologic:  alert & oriented X3.  Speech normal, gait  appropriate for age and unassisted Psych--  Cognition and judgment appear intact.  Cooperative with normal attention span and concentration.  Behavior appropriate. No anxious or depressed appearing.     Assessment & Plan:   (Patient seen along with   Merilynn Finland, medical student)  Red Eye:  Patient slept in contact lenses and woke with a red eye and foreign body sensation accompanied by eye tearing.   No eye pain, vision problems, no headaches or nausea, photophobia, and no corneal opacity noted on exam. Fluorescein test for corneal abrasion was negative.  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  Symptoms are suggestive of conjunctivitis. Plan:  Will prescribe azithromycin eye drops for 5 days Patient instructed to apply cold compress and artificial tears as needed and avoid contacts until better. Patient will go to  eye doctor if not better in next two days.

## 2015-06-06 NOTE — Patient Instructions (Signed)
Start using the antibiotic eye drop twice a day for 5 days Cold  compress   okay to use artificial tears but no other OTC eyedrops   call or go immediately to your eye doctor if not progressively getting better in the next 2 days or if worse   no contact lenses until completely well

## 2015-06-06 NOTE — Progress Notes (Signed)
Pre visit review using our clinic review tool, if applicable. No additional management support is needed unless otherwise documented below in the visit note. 

## 2015-09-19 ENCOUNTER — Encounter: Payer: Self-pay | Admitting: Family Medicine

## 2015-09-19 NOTE — Progress Notes (Signed)
Patient called in to schedule appointment. States she has been having heart palpations for past 2 weeks. No chest pain at this time nor SOB.  Appointment scheduled with Dr. Beverely Low  For tomorrow a.m. Advised to go to ED if she develops chest pain or SOB. Patient agreed.

## 2015-09-20 ENCOUNTER — Encounter: Payer: Self-pay | Admitting: Family Medicine

## 2015-09-20 ENCOUNTER — Ambulatory Visit (INDEPENDENT_AMBULATORY_CARE_PROVIDER_SITE_OTHER): Payer: 59 | Admitting: Family Medicine

## 2015-09-20 ENCOUNTER — Telehealth: Payer: Self-pay | Admitting: Family Medicine

## 2015-09-20 VITALS — BP 110/80 | HR 93 | Temp 98.0°F | Resp 16 | Wt 112.0 lb

## 2015-09-20 DIAGNOSIS — R002 Palpitations: Secondary | ICD-10-CM | POA: Diagnosis not present

## 2015-09-20 DIAGNOSIS — Z23 Encounter for immunization: Secondary | ICD-10-CM

## 2015-09-20 LAB — CBC WITH DIFFERENTIAL/PLATELET
Basophils Absolute: 0 10*3/uL (ref 0.0–0.1)
Basophils Relative: 0.4 % (ref 0.0–3.0)
Eosinophils Absolute: 0 10*3/uL (ref 0.0–0.7)
Eosinophils Relative: 0.3 % (ref 0.0–5.0)
HCT: 35.9 % — ABNORMAL LOW (ref 36.0–46.0)
Hemoglobin: 11.9 g/dL — ABNORMAL LOW (ref 12.0–15.0)
Lymphocytes Relative: 16 % (ref 12.0–46.0)
Lymphs Abs: 1 10*3/uL (ref 0.7–4.0)
MCHC: 33.2 g/dL (ref 30.0–36.0)
MCV: 84.7 fl (ref 78.0–100.0)
Monocytes Absolute: 0.3 10*3/uL (ref 0.1–1.0)
Monocytes Relative: 4.8 % (ref 3.0–12.0)
Neutro Abs: 5 10*3/uL (ref 1.4–7.7)
Neutrophils Relative %: 78.5 % — ABNORMAL HIGH (ref 43.0–77.0)
Platelets: 195 10*3/uL (ref 150.0–400.0)
RBC: 4.24 Mil/uL (ref 3.87–5.11)
RDW: 13.7 % (ref 11.5–15.5)
WBC: 6.4 10*3/uL (ref 4.0–10.5)

## 2015-09-20 LAB — BASIC METABOLIC PANEL
BUN: 12 mg/dL (ref 6–23)
CO2: 30 mEq/L (ref 19–32)
Calcium: 9.6 mg/dL (ref 8.4–10.5)
Chloride: 103 mEq/L (ref 96–112)
Creatinine, Ser: 0.68 mg/dL (ref 0.40–1.20)
GFR: 103.93 mL/min (ref >60.00)
Glucose, Bld: 83 mg/dL (ref 70–99)
Potassium: 4.1 mEq/L (ref 3.5–5.1)
Sodium: 138 mEq/L (ref 135–145)

## 2015-09-20 LAB — TSH: TSH: 2.34 u[IU]/mL (ref 0.35–4.50)

## 2015-09-20 NOTE — Assessment & Plan Note (Signed)
New.  Pt reports intermittent palpitations since last menstrual cycle.  Since cycles are heavy, this may be due to underlying anemia.  Check labs.  EKG WNL here in office.  Pt was asymptomatic while here today.  If all labs normal and no underlying cause determined, will proceed w/ holter monitor.  Reviewed supportive care and red flags that should prompt return.  Pt expressed understanding and is in agreement w/ plan.

## 2015-09-20 NOTE — Progress Notes (Signed)
Pre visit review using our clinic review tool, if applicable. No additional management support is needed unless otherwise documented below in the visit note. 

## 2015-09-20 NOTE — Telephone Encounter (Signed)
Pt would like to have transfer to you from [facility].

## 2015-09-20 NOTE — Telephone Encounter (Signed)
Okay with me, thank you 

## 2015-09-20 NOTE — Telephone Encounter (Signed)
Okay for transfer of care by both providers. Please schedule new patient appt at Pt's convenience. Okay to put 2-15 minute appts together if needed.

## 2015-09-20 NOTE — Telephone Encounter (Signed)
Please advise 

## 2015-09-20 NOTE — Telephone Encounter (Signed)
Ok for switch- she is very nice and I wish her the best

## 2015-09-20 NOTE — Progress Notes (Signed)
   Subjective:    Patient ID: Felicia Rollins, female    DOB: Jun 24, 1981, 34 y.o.   MRN: [id number]  HPI Palpitations- 'it feels like my heart is skipping a beat'.  Pt reports sxs are intermittent, occurred 7-8 times yesterday, none on Monday.  sxs started a 'few weeks ago'.  Pt reports sxs started w/ recent menses- which have been much heavier since birth of 2nd child.  No CP/pressure.  No SOB.  No dizziness.  Caffeine is <1 cup of coffee daily w/ ~1 glass of sweet tea daily.  No associated time of day.  Increased stress recently.  Hx of similar sxs 10 yrs ago- had normal cardiac w/u.   Review of Systems For ROS see HPI     Objective:   Physical Exam  Constitutional: She is oriented to person, place, and time. She appears well-developed and well-nourished. No distress.  HENT:  Head: Normocephalic and atraumatic.  Eyes: Conjunctivae and EOM are normal. Pupils are equal, round, and reactive to light.  Neck: Normal range of motion. Neck supple. No thyromegaly present.  Cardiovascular: Normal rate, regular rhythm, normal heart sounds and intact distal pulses.   No murmur heard. Pulmonary/Chest: Effort normal and breath sounds normal. No respiratory distress.  Abdominal: Soft. She exhibits no distension. There is no tenderness.  Musculoskeletal: She exhibits no edema.  Lymphadenopathy:    She has no cervical adenopathy.  Neurological: She is alert and oriented to person, place, and time.  Skin: Skin is warm and dry.  Psychiatric: She has a normal mood and affect. Her behavior is normal.  Vitals reviewed.         Assessment & Plan:

## 2015-09-20 NOTE — Patient Instructions (Signed)
We'll notify you of your lab results and determine the next steps We will most likely refer you for a holter monitor to better monitor your heart rate/rhythm over an extended period of time Try and avoid caffeine Make sure you have a stress outlet- you deserve it!! Call with any questions or concerns Hang in there!!

## 2015-09-21 ENCOUNTER — Telehealth: Payer: Self-pay | Admitting: Family Medicine

## 2015-09-21 ENCOUNTER — Other Ambulatory Visit: Payer: Self-pay | Admitting: Family Medicine

## 2015-09-21 DIAGNOSIS — R002 Palpitations: Secondary | ICD-10-CM

## 2015-09-21 NOTE — Telephone Encounter (Signed)
Pt returning call. Please call her after 1:30pm.

## 2015-09-21 NOTE — Telephone Encounter (Signed)
Pt informed of lab results. 

## 2015-09-25 ENCOUNTER — Ambulatory Visit (INDEPENDENT_AMBULATORY_CARE_PROVIDER_SITE_OTHER): Payer: 59

## 2015-09-25 DIAGNOSIS — R002 Palpitations: Secondary | ICD-10-CM

## 2015-10-05 ENCOUNTER — Telehealth: Payer: Self-pay | Admitting: Family Medicine

## 2015-10-05 NOTE — Telephone Encounter (Signed)
Called pt and advised that we will contact her as soon as cardiololgy has resulted on it.

## 2015-10-05 NOTE — Telephone Encounter (Signed)
Do we give the pt these results or does it come from cards?

## 2015-10-05 NOTE — Telephone Encounter (Signed)
Pt calling for results of holter montior. She wore it 2 days last week and returned it Thursday or Friday. They told her she would hear something in a few days. She is wanting to f/u for results. Call back # [phone]. I saw they are in but have not been reviewed yet and did inform pt they need reviewed by provider and then she will get a call.

## 2015-10-05 NOTE — Telephone Encounter (Signed)
I ordered it, so I give results but it has not been read by cardiology yet.  Still waiting

## 2015-10-06 NOTE — Telephone Encounter (Signed)
Called and spoke with the pt and informed her of recent holter monitor results and note.  Pt stated that she did not understand the rare extra beats.  She would like this explained more.   Please advise.//AB/CMA

## 2015-10-06 NOTE — Telephone Encounter (Signed)
Called and spoke with the pt and informed her of the note below.  Pt verbalized understanding.//AB/CMA 

## 2015-10-06 NOTE — Telephone Encounter (Signed)
It is normal- and quite common- for the heart to throw an extra beat into the normal rhythm here and there (it would be impossible for the heart to stay in perfect rhythm 100% of the time, particularly as it speeds up and slows down).  This is likely what she is feeling when she has palpitations.  There is nothing that needs to be done about this.

## 2015-10-06 NOTE — Telephone Encounter (Signed)
-----   Message from Sheliah Hatch, MD sent at 10/06/2015  8:06 AM EDT ----- Normal sinus w/ rare extra beats.  No arrhythmia.  This is great news

## 2015-10-26 ENCOUNTER — Telehealth: Payer: Self-pay | Admitting: Family Medicine

## 2015-10-26 ENCOUNTER — Other Ambulatory Visit: Payer: Self-pay | Admitting: Family Medicine

## 2015-10-26 NOTE — Telephone Encounter (Signed)
Medication filled to pharmacy as requested.   

## 2015-10-26 NOTE — Telephone Encounter (Signed)
Caller name: Anabel Halonleina Bowmer  Relationship to patient: Self   Can be reached: [phone]   Reason for call: Pt says that she is still having pain, pt needs a refill on Meloxicam and cyclobenzaprine. Pt would like to know if she need an appointment first?    Please Advise

## 2015-12-23 ENCOUNTER — Ambulatory Visit (INDEPENDENT_AMBULATORY_CARE_PROVIDER_SITE_OTHER): Payer: 59 | Admitting: Physician Assistant

## 2015-12-23 VITALS — BP 98/68 | HR 68 | Temp 98.3°F | Resp 16 | Ht 63.0 in | Wt 114.8 lb

## 2015-12-23 DIAGNOSIS — S61011A Laceration without foreign body of right thumb without damage to nail, initial encounter: Secondary | ICD-10-CM

## 2015-12-23 NOTE — Progress Notes (Signed)
   12/23/2015 2:53 PM   DOB: 04/20/1981 / MRN: [id number]  SUBJECTIVE:  Felicia Rollins is a 34 y.o. female presenting for cutting her right thumb on a mandolin slicer.  Reports she was able to achieve hemostasis with pressure and says there is a skin flap.    Immunization History  Administered Date(s) Administered  . Influenza Split 11/01/2011  . Influenza Whole 10/12/2009, 10/25/2010  . Influenza,inj,Quad PF,36+ Mos 10/31/2014, 09/20/2015  . Rho (D) Immune Globulin 12/15/2012  . Tdap 10/29/2011     She is allergic to shellfish allergy.   She  has no past medical history on file.    She  reports that she has never smoked. She has never used smokeless tobacco. She reports that she does not drink alcohol or use illicit drugs. She  reports that she currently engages in sexual activity. The patient  has past surgical history that includes Breast surgery; Dilation and curettage of uterus; and Tonsillectomy.  Her family history includes Aortic stenosis in her maternal grandfather; Cancer in her maternal grandmother, mother, and paternal aunt; Diabetes in her father, maternal grandfather, and paternal grandmother; Heart attack in her father and paternal grandfather; Kidney disease in her maternal grandfather.  Review of Systems  Gastrointestinal: Negative for nausea.  Musculoskeletal: Negative for myalgias and joint pain.  Neurological: Negative for dizziness and headaches.    Problem list and medications reviewed and updated by myself where necessary, and exist elsewhere in the encounter.   OBJECTIVE:  BP 98/68 mmHg  Pulse 68  Temp(Src) 98.3 F (36.8 C) (Oral)  Resp 16  Ht 5\' 3"  (1.6 m)  Wt 114 lb 12.8 oz (52.073 kg)  BMI 20.34 kg/m2  SpO2 98%  LMP 11/23/2015  Physical Exam  Musculoskeletal:       Hands: Vitals reviewed.   No results found for this or any previous visit (from the past 48 hour(s)).  Procedure: Wound cleansed with soap and water.  Hemostasis achieved  with pressure.  Skin flap glued down with topical adhesive.  No complications.    ASSESSMENT AND PLAN  Felicia Rollins was seen today for laceration.  Diagnoses and all orders for this visit:  Thumb laceration, right, initial encounter: NVB intact.  Skin flap glued down with topical adhesive.  Bandage placed.     The patient was advised to call or return to clinic if she does not see an improvement in symptoms or to seek the care of the closest emergency department if she worsens with the above plan.   Deliah BostonMichael Jeran Hiltz, MHS, PA-C [facility] 12/23/2015 2:53 PM

## 2015-12-23 NOTE — Patient Instructions (Signed)
Return to clinic as needed

## 2016-01-31 ENCOUNTER — Telehealth: Payer: Self-pay | Admitting: *Deleted

## 2016-01-31 NOTE — Telephone Encounter (Signed)
Unable to reach patient at time of pre-visit call. Left message for patient to return call when available.  

## 2016-02-01 ENCOUNTER — Ambulatory Visit (INDEPENDENT_AMBULATORY_CARE_PROVIDER_SITE_OTHER): Payer: 59 | Admitting: Internal Medicine

## 2016-02-01 ENCOUNTER — Encounter: Payer: Self-pay | Admitting: Internal Medicine

## 2016-02-01 VITALS — BP 102/66 | HR 75 | Temp 97.6°F | Ht 63.0 in | Wt 113.2 lb

## 2016-02-01 DIAGNOSIS — M62838 Other muscle spasm: Secondary | ICD-10-CM

## 2016-02-01 DIAGNOSIS — E559 Vitamin D deficiency, unspecified: Secondary | ICD-10-CM

## 2016-02-01 DIAGNOSIS — R002 Palpitations: Secondary | ICD-10-CM

## 2016-02-01 DIAGNOSIS — Z8249 Family history of ischemic heart disease and other diseases of the circulatory system: Secondary | ICD-10-CM

## 2016-02-01 LAB — LIPID PANEL
Cholesterol: 173 mg/dL (ref 0–200)
HDL: 37.9 mg/dL — ABNORMAL LOW (ref >39.00)
LDL Cholesterol: 107 mg/dL — ABNORMAL HIGH (ref 0–99)
NonHDL: 135.31
Total CHOL/HDL Ratio: 5
Triglycerides: 143 mg/dL (ref 0.0–149.0)
VLDL: 28.6 mg/dL (ref 0.0–40.0)

## 2016-02-01 LAB — VITAMIN D 25 HYDROXY (VIT D DEFICIENCY, FRACTURES): VITD: 40.06 ng/mL (ref 30.00–100.00)

## 2016-02-01 NOTE — Progress Notes (Signed)
Pre visit review using our clinic review tool, if applicable. No additional management support is needed unless otherwise documented below in the visit note. 

## 2016-02-01 NOTE — Patient Instructions (Signed)
GO TO THE LAB : Get the blood work     Return to the office as needed and consider schedule a physical exam in 8-10 months if needed

## 2016-02-01 NOTE — Progress Notes (Signed)
Subjective:    Patient ID: Felicia Rollins, female    DOB: 1981/01/14, 35 y.o.   MRN: [id number]  DOS:  02/01/2016 Type of visit - description : New patient to me, to get established Interval history: Still has occasional palpitations, no associated chest pain, dizziness, loss of consciousness. Neck spasms: Well controlled with NSAIDs and occasional Flexeril. Concern about her FH CAD, likes her cholesterol checked. Diet is healthy, does not have time to exercise much   Review of Systems   Past Medical History  Diagnosis Date  . Muscle spasm     Frequent, keeps Mobic and Flexeril on hand  . Hyperlipidemia   . History of gastroenteritis   . Palpitations   . H/O hypokalemia     Past Surgical History  Procedure Laterality Date  . Breast surgery      biopsy  . Dilation and curettage of uterus    . Tonsillectomy      Social History   Social History  . Marital Status: Married    Spouse Name: N/A  . Number of Children: 2  . Years of Education: N/A   Occupational History  . Futures trader    Social History Main Topics  . Smoking status: Never Smoker   . Smokeless tobacco: Never Used  . Alcohol Use: No     Comment: rarely  . Drug Use: No  . Sexual Activity: Yes   Other Topics Concern  . Not on file   Social History Narrative   3 and 6 y/o children        Medication List       This list is accurate as of: 02/01/16  8:05 PM.  Always use your most recent med list.               cyclobenzaprine 10 MG tablet  Commonly known as:  FLEXERIL  TAKE 1 TABLET BY MOUTH EVERY 8 HOURS AS NEEDED FOR MUSCLE SPASMS     meloxicam 15 MG tablet  Commonly known as:  MOBIC  TAKE 1 TABLET (15 MG TOTAL) BY MOUTH DAILY.     Vitamin D3 3000 units Tabs  Take 1 tablet by mouth daily.           Objective:   Physical Exam BP 102/66 mmHg  Pulse 75  Temp(Src) 97.6 F (36.4 C) (Oral)  Ht  (1.6 m)  Wt 113 lb 4 oz (51.37 kg)  BMI 20.07 kg/m2  SpO2 97%  LMP  01/27/2016 (Exact Date) General:   Well developed, well nourished . NAD.  HEENT:  Normocephalic . Face symmetric, atraumatic. Neck: No thyromegaly Lungs:  CTA B Normal respiratory effort, no intercostal retractions, no accessory muscle use. Heart: RRR,  no murmur.  No pretibial edema bilaterally  Skin: Not pale. Not jaundice Neurologic:  alert & oriented X3.  Speech normal, gait appropriate for age and unassisted Psych--  Cognition and judgment appear intact.  Cooperative with normal attention span and concentration.  Behavior appropriate. No anxious or depressed appearing.      Assessment & Plan:   Assessment Neck spasms Palpitations neg Holter 08-2015 Vitamin D deficiency + FH CAD father early age  Plan: Neck spasms:  on medications as needed, well-controlled Palpitations: Still has symptoms, no red flags, recommend observation Vitamin D deficiency: On daily supplements, check labs Family history of CAD: Request a FLP, will do. Continue healthy diet, try to exercise more Primary care: Sees gynecology. RTC as needed for acute problems or CPX (  gynecology may be providing immunizations are labs thus may not need CPX in this office) Not on BCP

## 2016-02-10 ENCOUNTER — Emergency Department (HOSPITAL_BASED_OUTPATIENT_CLINIC_OR_DEPARTMENT_OTHER): Payer: 59

## 2016-02-10 ENCOUNTER — Emergency Department (HOSPITAL_BASED_OUTPATIENT_CLINIC_OR_DEPARTMENT_OTHER)
Admission: EM | Admit: 2016-02-10 | Discharge: 2016-02-10 | Disposition: A | Discharge status: Home / Self Care | Payer: 59 | Attending: Emergency Medicine | Admitting: Emergency Medicine

## 2016-02-10 ENCOUNTER — Encounter (HOSPITAL_BASED_OUTPATIENT_CLINIC_OR_DEPARTMENT_OTHER): Payer: Self-pay | Admitting: *Deleted

## 2016-02-10 DIAGNOSIS — Z8719 Personal history of other diseases of the digestive system: Secondary | ICD-10-CM | POA: Insufficient documentation

## 2016-02-10 DIAGNOSIS — Y998 Other external cause status: Secondary | ICD-10-CM | POA: Diagnosis not present

## 2016-02-10 DIAGNOSIS — Y9289 Other specified places as the place of occurrence of the external cause: Secondary | ICD-10-CM | POA: Diagnosis not present

## 2016-02-10 DIAGNOSIS — S99911A Unspecified injury of right ankle, initial encounter: Secondary | ICD-10-CM | POA: Insufficient documentation

## 2016-02-10 DIAGNOSIS — W2104XA Struck by golf ball, initial encounter: Secondary | ICD-10-CM | POA: Diagnosis not present

## 2016-02-10 DIAGNOSIS — Z79899 Other long term (current) drug therapy: Secondary | ICD-10-CM | POA: Insufficient documentation

## 2016-02-10 DIAGNOSIS — Y9353 Activity, golf: Secondary | ICD-10-CM | POA: Insufficient documentation

## 2016-02-10 DIAGNOSIS — Z8639 Personal history of other endocrine, nutritional and metabolic disease: Secondary | ICD-10-CM | POA: Diagnosis not present

## 2016-02-10 DIAGNOSIS — M25571 Pain in right ankle and joints of right foot: Secondary | ICD-10-CM

## 2016-02-10 MED ORDER — IBUPROFEN 800 MG PO TABS
800.0000 mg | ORAL_TABLET | Freq: Once | ORAL | Status: AC
  Administered 2016-02-10: 800 mg via ORAL
  Filled 2016-02-10: qty 1

## 2016-02-10 MED ORDER — ACETAMINOPHEN 500 MG PO TABS
1000.0000 mg | ORAL_TABLET | Freq: Once | ORAL | Status: AC
  Administered 2016-02-10: 1000 mg via ORAL
  Filled 2016-02-10: qty 2

## 2016-02-10 NOTE — ED Notes (Signed)
MD at bedside. 

## 2016-02-10 NOTE — Discharge Instructions (Signed)
Take 4 over the counter ibuprofen tablets 3 times a day or 2 over-the-counter naproxen tablets twice a day for pain.  Ankle Pain Ankle pain is a common symptom. The bones, cartilage, tendons, and muscles of the ankle joint perform a lot of work each day. The ankle joint holds your body weight and allows you to move around. Ankle pain can occur on either side or back of 1 or both ankles. Ankle pain may be sharp and burning or dull and aching. There may be tenderness, stiffness, redness, or warmth around the ankle. The pain occurs more often when a person walks or puts pressure on the ankle. CAUSES  There are many reasons ankle pain can develop. It is important to work with your caregiver to identify the cause since many conditions can impact the bones, cartilage, muscles, and tendons. Causes for ankle pain include:  Injury, including a break (fracture), sprain, or strain often due to a fall, sports, or a high-impact activity.  Swelling (inflammation) of a tendon (tendonitis).  Achilles tendon rupture.  Ankle instability after repeated sprains and strains.  Poor foot alignment.  Pressure on a nerve (tarsal tunnel syndrome).  Arthritis in the ankle or the lining of the ankle.  Crystal formation in the ankle (gout or pseudogout). DIAGNOSIS  A diagnosis is based on your medical history, your symptoms, results of your physical exam, and results of diagnostic tests. Diagnostic tests may include X-ray exams or a computerized magnetic scan (magnetic resonance imaging, MRI). TREATMENT  Treatment will depend on the cause of your ankle pain and may include:  Keeping pressure off the ankle and limiting activities.  Using crutches or other walking support (a cane or brace).  Using rest, ice, compression, and elevation.  Participating in physical therapy or home exercises.  Wearing shoe inserts or special shoes.  Losing weight.  Taking medications to reduce pain or swelling or receiving an  injection.  Undergoing surgery. HOME CARE INSTRUCTIONS   Only take over-the-counter or prescription medicines for pain, discomfort, or fever as directed by your caregiver.  Put ice on the injured area.  Put ice in a plastic bag.  Place a towel between your skin and the bag.  Leave the ice on for 15-20 minutes at a time, 03-04 times a day.  Keep your leg raised (elevated) when possible to lessen swelling.  Avoid activities that cause ankle pain.  Follow specific exercises as directed by your caregiver.  Record how often you have ankle pain, the location of the pain, and what it feels like. This information may be helpful to you and your caregiver.  Ask your caregiver about returning to work or sports and whether you should drive.  Follow up with your caregiver for further examination, therapy, or testing as directed. SEEK MEDICAL CARE IF:   Pain or swelling continues or worsens beyond 1 week.  You have an oral temperature above 102 F (38.9 C).  You are feeling unwell or have chills.  You are having an increasingly difficult time with walking.  You have loss of sensation or other new symptoms.  You have questions or concerns. MAKE SURE YOU:   Understand these instructions.  Will watch your condition.  Will get help right away if you are not doing well or get worse.   This information is not intended to replace advice given to you by your health care provider. Make sure you discuss any questions you have with your health care provider.   Document Released: 06/05/2010  Document Revised: 03/09/2012 Document Reviewed: 07/18/2015 Elsevier Interactive Patient Education [employer].

## 2016-02-10 NOTE — ED Notes (Signed)
Pt was hit with a golf ball on her right ankle today.  Pt states that she passed out after it happened from the pain.  Ambulatory, pt afraid to bear weight on ankle.

## 2016-02-10 NOTE — ED Provider Notes (Signed)
CSN: [id number]     Arrival date & time 02/10/16  1317 History   First MD Initiated Contact with Patient 02/10/16 1339     Chief Complaint  Patient presents with  . Ankle Injury     (Consider location/radiation/quality/duration/timing/severity/associated sxs/prior Treatment) Patient is a 35 y.o. female presenting with ankle pain. The history is provided by the patient.  Ankle Pain Location:  Ankle Time since incident:  1 hour Injury: yes   Mechanism of injury comment:  Struck by golf ball Ankle location:  R ankle Pain details:    Quality:  Aching and sharp   Radiates to:  Does not radiate   Severity:  Moderate   Onset quality:  Sudden   Duration:  1 hour   Timing:  Constant   Progression:  Unchanged Chronicity:  New Dislocation: no   Prior injury to area:  No Relieved by:  Nothing Worsened by:  Flexion and bearing weight Ineffective treatments:  None tried Associated symptoms: no fever     35 yo F with a chief complaint right ankle pain. This was struck by a golf ball about an hour ago. Patient feels like she can't walk on it since incident. Denies other injury. Has some localized swelling to the area.  Past Medical History  Diagnosis Date  . Muscle spasm     Frequent, keeps Mobic and Flexeril on hand  . Hyperlipidemia   . History of gastroenteritis   . Palpitations   . H/O hypokalemia    Past Surgical History  Procedure Laterality Date  . Breast surgery      biopsy  . Dilation and curettage of uterus    . Tonsillectomy     Family History  Problem Relation Age of Onset  . Breast cancer Mother     breast  . Diabetes Father   . Heart attack Father     early onset (late 30s)  . Cancer Maternal Grandmother     liver  . Aortic stenosis Maternal Grandfather   . Diabetes Maternal Grandfather   . Kidney disease Maternal Grandfather     failure  . Diabetes Paternal Grandmother   . Heart attack Paternal Grandfather   . Breast cancer Paternal Aunt     breast    Social History  Substance Use Topics  . Smoking status: Never Smoker   . Smokeless tobacco: Never Used  . Alcohol Use: No     Comment: rarely   OB History    Gravida Para Term Preterm AB TAB SAB Ectopic Multiple Living   Review of Systems  Constitutional: Negative for fever and chills.  HENT: Negative for congestion and rhinorrhea.   Eyes: Negative for redness and visual disturbance.  Respiratory: Negative for shortness of breath and wheezing.   Cardiovascular: Negative for chest pain and palpitations.  Gastrointestinal: Negative for nausea and vomiting.  Genitourinary: Negative for dysuria and urgency.  Musculoskeletal: Positive for myalgias, arthralgias and gait problem.  Skin: Negative for pallor and wound.  Neurological: Negative for dizziness and headaches.      Allergies  Shellfish allergy  Home Medications   Prior to Admission medications   Medication Sig Start Date End Date Taking? Authorizing Provider  Cholecalciferol (VITAMIN D3) 3000 UNITS TABS Take 1 tablet by mouth daily.    Historical Provider, MD  cyclobenzaprine (FLEXERIL) 10 MG tablet TAKE 1 TABLET BY MOUTH EVERY 8 HOURS AS NEEDED FOR MUSCLE SPASMS  Patient not taking: Reported on 12/23/2015 10/26/15   Sheliah Hatch, MD  meloxicam (MOBIC) 15 MG tablet TAKE 1 TABLET (15 MG TOTAL) BY MOUTH DAILY. Patient not taking: Reported on 12/23/2015 10/26/15   Sheliah Hatch, MD   BP 103/83 mmHg  Pulse 85  Temp(Src) 98.1 F (36.7 C) (Oral)  Resp 18  Ht  (1.626 m)  Wt 115 lb (52.164 kg)  BMI 19.73 kg/m2  SpO2 100%  LMP 01/27/2016 (Exact Date) Physical Exam  Constitutional: She is oriented to person, place, and time. She appears well-developed and well-nourished. No distress.  HENT:  Head: Normocephalic and atraumatic.  Eyes: EOM are normal. Pupils are equal, round, and reactive to light.  Neck: Normal range of motion. Neck supple.  Cardiovascular: Normal rate and regular  rhythm.  Exam reveals no gallop and no friction rub.   No murmur heard. Pulmonary/Chest: Effort normal. She has no wheezes. She has no rales.  Abdominal: Soft. She exhibits no distension. There is no tenderness.  Musculoskeletal: She exhibits edema (tender palpation about the right lateral malleolus.) and tenderness.  Pulse motor and sensation intact distally.  Neurological: She is alert and oriented to person, place, and time.  Skin: Skin is warm and dry. She is not diaphoretic.  Psychiatric: She has a normal mood and affect. Her behavior is normal.  Nursing note and vitals reviewed.   ED Course  Procedures (including critical care time) Labs Review Labs Reviewed - No data to display  Imaging Review No results found. I have personally reviewed and evaluated these images and lab results as part of my medical decision-making.   EKG Interpretation None      MDM   Final diagnoses:  None    35 yo F with a chief complaint of right ankle pain. Patient was struck by a golf ball there. X-rays negative. Discharge home.  3:24 PM:  I have discussed the diagnosis/risks/treatment options with the patient and family and believe the pt to be eligible for discharge home to follow-up with PCP. We also discussed returning to the ED immediately if new or worsening sx occur. We discussed the sx which are most concerning (e.g., sudden worsening pain, inability to walk. )that necessitate immediate return. Medications administered to the patient during their visit and any new prescriptions provided to the patient are listed below.  Medications given during this visit Medications  acetaminophen (TYLENOL) tablet 1,000 mg (1,000 mg Oral Given 02/10/16 1429)  ibuprofen (ADVIL,MOTRIN) tablet 800 mg (800 mg Oral Given 02/10/16 1429)    Discharge Medication List as of 02/10/2016  2:24 PM      The patient appears reasonably screen and/or stabilized for discharge and I doubt any other medical condition or  other [facility] requiring further screening, evaluation, or treatment in the ED at this time prior to discharge.      Melene Plan, DO 02/10/16 1524

## 2016-06-18 ENCOUNTER — Ambulatory Visit (INDEPENDENT_AMBULATORY_CARE_PROVIDER_SITE_OTHER): Payer: 59 | Admitting: Internal Medicine

## 2016-06-18 ENCOUNTER — Encounter: Payer: Self-pay | Admitting: Internal Medicine

## 2016-06-18 VITALS — BP 102/74 | HR 91 | Temp 98.1°F | Ht 63.0 in | Wt 115.0 lb

## 2016-06-18 DIAGNOSIS — S92911D Unspecified fracture of right toe(s), subsequent encounter for fracture with routine healing: Secondary | ICD-10-CM | POA: Diagnosis not present

## 2016-06-18 NOTE — Progress Notes (Signed)
Pre visit review using our clinic review tool, if applicable. No additional management support is needed unless otherwise documented below in the visit note. 

## 2016-06-18 NOTE — Progress Notes (Signed)
   Subjective:    Patient ID: Felicia Rollins, female    DOB: 11/10/1981, 35 y.o.   MRN: [id number]  DOS:  06/18/2016 Type of visit - description : Acute visit Interval history: Injure her right foot yesterday at home, bumped it on a chair. Went to urgent care, x-rays show a fracture of the fifth toe. She was prescribed apparently a post-op shoe but is making her more uncomfortable. Swelling is slightly better today, pain better after ibuprofen but pain increases when she puts pressure or try to walk.   Review of Systems No other injuries  Past Medical History  Diagnosis Date  . Muscle spasm     Frequent, keeps Mobic and Flexeril on hand  . Hyperlipidemia   . History of gastroenteritis   . Palpitations   . H/O hypokalemia     Past Surgical History  Procedure Laterality Date  . Breast surgery      biopsy  . Dilation and curettage of uterus    . Tonsillectomy      Social History   Social History  . Marital Status: Married    Spouse Name: N/A  . Number of Children: 2  . Years of Education: N/A   Occupational History  . Futures tradercontract manager    Social History Main Topics  . Smoking status: Never Smoker   . Smokeless tobacco: Never Used  . Alcohol Use: No     Comment: rarely  . Drug Use: No  . Sexual Activity: Yes   Other Topics Concern  . Not on file   Social History Narrative   3 and 6 y/o children        Medication List       This list is accurate as of: 06/18/16 11:59 PM.  Always use your most recent med list.               ibuprofen 200 MG tablet  Commonly known as:  ADVIL,MOTRIN  Take 800 mg by mouth every 6 (six) hours as needed for moderate pain.     Vitamin D3 3000 units Tabs  Take 1 tablet by mouth daily.           Objective:   Physical Exam BP 102/74 mmHg  Pulse 91  Temp(Src) 98.1 F (36.7 C) (Oral)  Ht 5\' 3"  (1.6 m)  Wt 115 lb (52.164 kg)  BMI 20.38 kg/m2  SpO2 99%  LMP 05/12/2016 (Approximate) General:   Well developed, well  nourished, sitting in a wheelchair .  HEENT:  Normocephalic . Face symmetric, atraumatic MSK: Right foot: Mild discoloration at the base of the fifth toe, 5th toe very TTP without deformities; normal pedal pulses, otherwise foot exam normal Neurologic:  alert & oriented X3.  Speech normal, gait not tested  Psych--  Cognition and judgment appear intact.  Cooperative with normal attention span and concentration.  Behavior appropriate. No anxious or depressed appearing.      Assessment & Plan:   Assessment Neck spasms Palpitations neg Holter 08-2015 Vitamin D deficiency + FH CAD father early age  PLAN: FX 5th toe: Recommend rest, leg elevation, Motrin. If she can tolerate a postop shoe, recommend wide tennis shoes. States that she needs to mobilize, has 2 children, can't stop doing things:  we agreed to use crutches for a few days, crutches provided . If not better will recommend sports medicine eval.

## 2016-06-18 NOTE — Patient Instructions (Signed)
Leg elevation  ICE  Use crutches  Comfortable shoes  As soon as you can buddy tape the toe  Call if not gradually improving in the next few days

## 2016-06-19 DIAGNOSIS — Z09 Encounter for follow-up examination after completed treatment for conditions other than malignant neoplasm: Secondary | ICD-10-CM | POA: Insufficient documentation

## 2016-06-19 NOTE — Assessment & Plan Note (Addendum)
FX 5th toe: Recommend rest, leg elevation, Motrin. If she can tolerate a postop shoe, recommend wide tennis shoes. States that she needs to mobilize, has 2 children, can't stop doing things:  we agreed to use crutches for a few days, crutches provided. If not better will recommend sports medicine eval.

## 2016-06-28 ENCOUNTER — Telehealth: Payer: Self-pay

## 2016-06-28 DIAGNOSIS — S92911A Unspecified fracture of right toe(s), initial encounter for closed fracture: Secondary | ICD-10-CM

## 2016-06-28 NOTE — Telephone Encounter (Signed)
Seen on 06/18/2016 for R toe fracture. Okay to refer?

## 2016-06-28 NOTE — Telephone Encounter (Signed)
Arrange referral

## 2016-06-28 NOTE — Telephone Encounter (Signed)
Patient called Dr. Lajoyce Cornersuda at [facility] and was able to schedule appointment.

## 2016-06-28 NOTE — Telephone Encounter (Addendum)
Referral cancelled. 

## 2016-06-28 NOTE — Telephone Encounter (Signed)
Patient requesting a referral to orthopedics foot swelling and hard to walk. [phone]

## 2016-10-14 ENCOUNTER — Other Ambulatory Visit: Payer: Self-pay | Admitting: Obstetrics and Gynecology

## 2016-10-14 DIAGNOSIS — Z1231 Encounter for screening mammogram for malignant neoplasm of breast: Secondary | ICD-10-CM

## 2016-10-16 ENCOUNTER — Ambulatory Visit
Admission: RE | Admit: 2016-10-16 | Discharge: 2016-10-16 | Disposition: A | Discharge status: Home / Self Care | Payer: 59 | Source: Ambulatory Visit | Attending: Obstetrics and Gynecology | Admitting: Obstetrics and Gynecology

## 2016-10-16 DIAGNOSIS — Z1231 Encounter for screening mammogram for malignant neoplasm of breast: Secondary | ICD-10-CM

## 2016-10-24 ENCOUNTER — Encounter: Payer: Self-pay | Admitting: Family Medicine

## 2016-10-24 ENCOUNTER — Ambulatory Visit (INDEPENDENT_AMBULATORY_CARE_PROVIDER_SITE_OTHER): Payer: 59 | Admitting: Family Medicine

## 2016-10-24 VITALS — BP 106/60 | HR 95 | Temp 98.1°F | Ht 63.0 in | Wt 114.6 lb

## 2016-10-24 DIAGNOSIS — H6982 Other specified disorders of Eustachian tube, left ear: Secondary | ICD-10-CM | POA: Diagnosis not present

## 2016-10-24 DIAGNOSIS — J3089 Other allergic rhinitis: Secondary | ICD-10-CM

## 2016-10-24 NOTE — Progress Notes (Signed)
Chief Complaint  Patient presents with  . Allergies    runny nose,dry throat,drainage,sneezing,watery eyes,cough(dry)-sxs started on Sat    Felicia Rollins here for URI complaints.  Duration: 5 days  Associated symptoms: sinus congestion, rhinorrhea, itchy watery eyes, ear fullness and cough Denies: sore throat, shortness of breath and fevers Treatment to date: fluids and rest, cetirizine OTC Sick contacts: Husband had a cough a couple weks ago  ROS:  Const: Denies fevers HEENT: As noted in HPI Lungs: No SOB  Past Medical History:  Diagnosis Date  . H/O hypokalemia   . History of gastroenteritis   . Hyperlipidemia   . Muscle spasm    Frequent, keeps Mobic and Flexeril on hand  . Palpitations    Family History  Problem Relation Age of Onset  . Breast cancer Mother     breast  . Diabetes Father   . Heart attack Father     early onset (late 30s)  . Cancer Maternal Grandmother     liver  . Aortic stenosis Maternal Grandfather   . Diabetes Maternal Grandfather   . Kidney disease Maternal Grandfather     failure  . Diabetes Paternal Grandmother   . Heart attack Paternal Grandfather   . Breast cancer Paternal Aunt     breast    BP 106/60 (BP Location: Left Arm, Patient Position: Sitting, Cuff Size: Normal)   Pulse 95   Temp 98.1 F (36.7 C) (Oral)   Ht 5\' 3"  (1.6 m)   Wt 114 lb 9.6 oz (52 kg)   LMP 10/10/2016 (Approximate)   SpO2 99%   BMI 20.30 kg/m  General: Awake, alert, appears stated age HEENT: AT, [place], ears patent b/l and TM neg on R, retracted without erythema or fluid on the L, nares patent w/o discharge, no sinus tenderness pharynx pink and without exudates, MMM Neck: No masses or asymmetry Heart: RRR, no murmurs, no bruits Lungs: CTAB, no accessory muscle use Psych: Age appropriate judgment and insight, normal mood and affect  Acute allergic rhinitis due to other allergen, unspecified seasonality  Dysfunction of left eustachian tube  Orders as  above. Flonase, continue Zyrtec. F/u prn. Pt voiced understanding and agreement to the plan.  Jilda Rocheicholas Paul FranklinWendling, DO 10/24/16 1:41 PM

## 2016-10-24 NOTE — Patient Instructions (Signed)
Flonase (fluticasone); nasal spray that is over the counter. 2 sprays each nostril, once daily. Aim towards the same side eye when you spray.  Make sure your Zyretic (certirizine) is 10 mg daily.

## 2016-10-24 NOTE — Progress Notes (Signed)
Pre visit review using our clinic review tool, if applicable. No additional management support is needed unless otherwise documented below in the visit note. 

## 2016-10-29 ENCOUNTER — Ambulatory Visit (INDEPENDENT_AMBULATORY_CARE_PROVIDER_SITE_OTHER): Payer: 59 | Admitting: Behavioral Health

## 2016-10-29 DIAGNOSIS — Z23 Encounter for immunization: Secondary | ICD-10-CM

## 2016-11-04 IMAGING — DX DG ANKLE COMPLETE 3+V*R*
3 series · 3 of 3 positions shown · non-contrast
Comparison: None.

CLINICAL DATA: Golf ball injury involving the lateral malleolus.
Initial encounter.

EXAM:
RIGHT ANKLE - COMPLETE 3+ VIEW

[ankle ap]
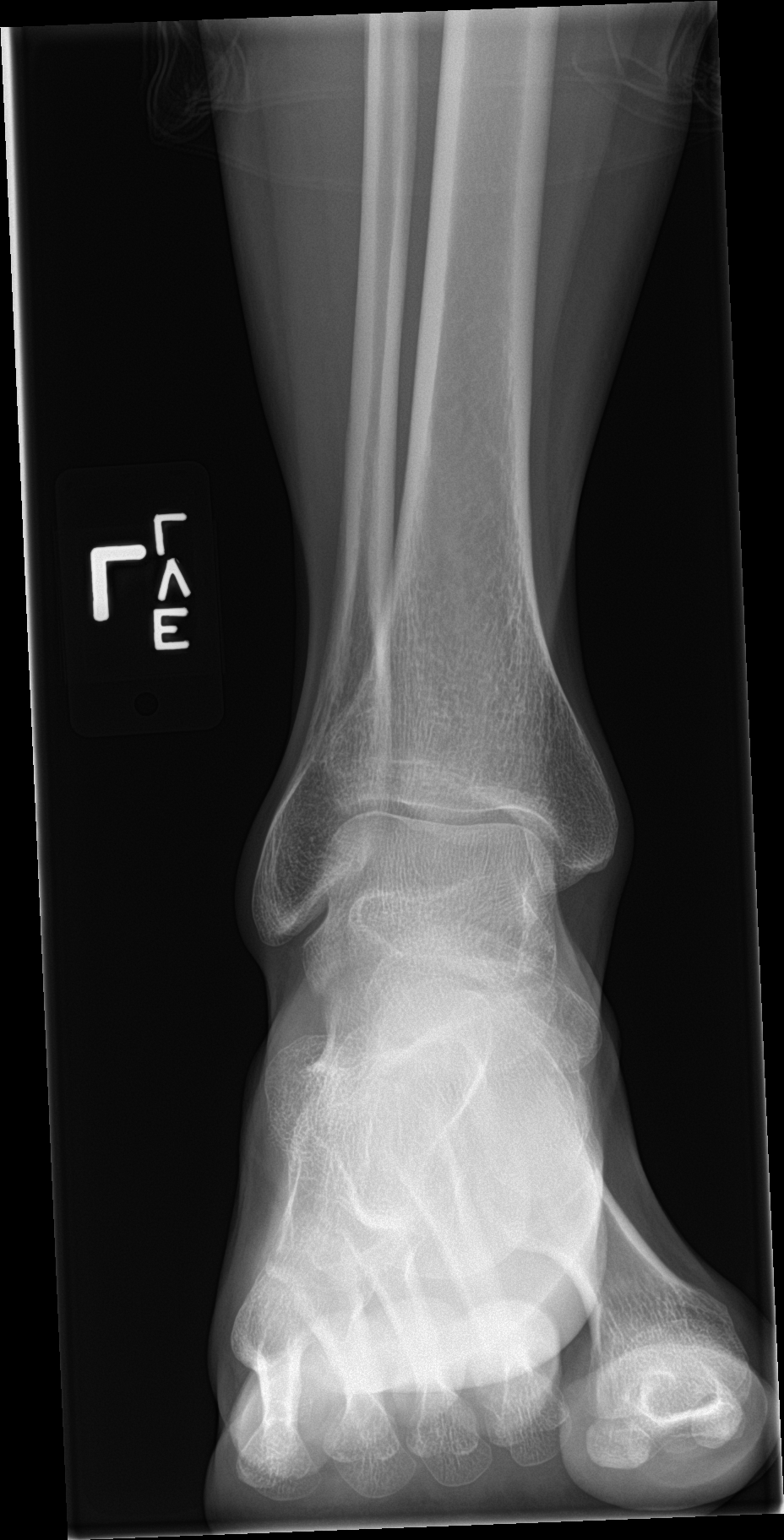

[ankle obl]
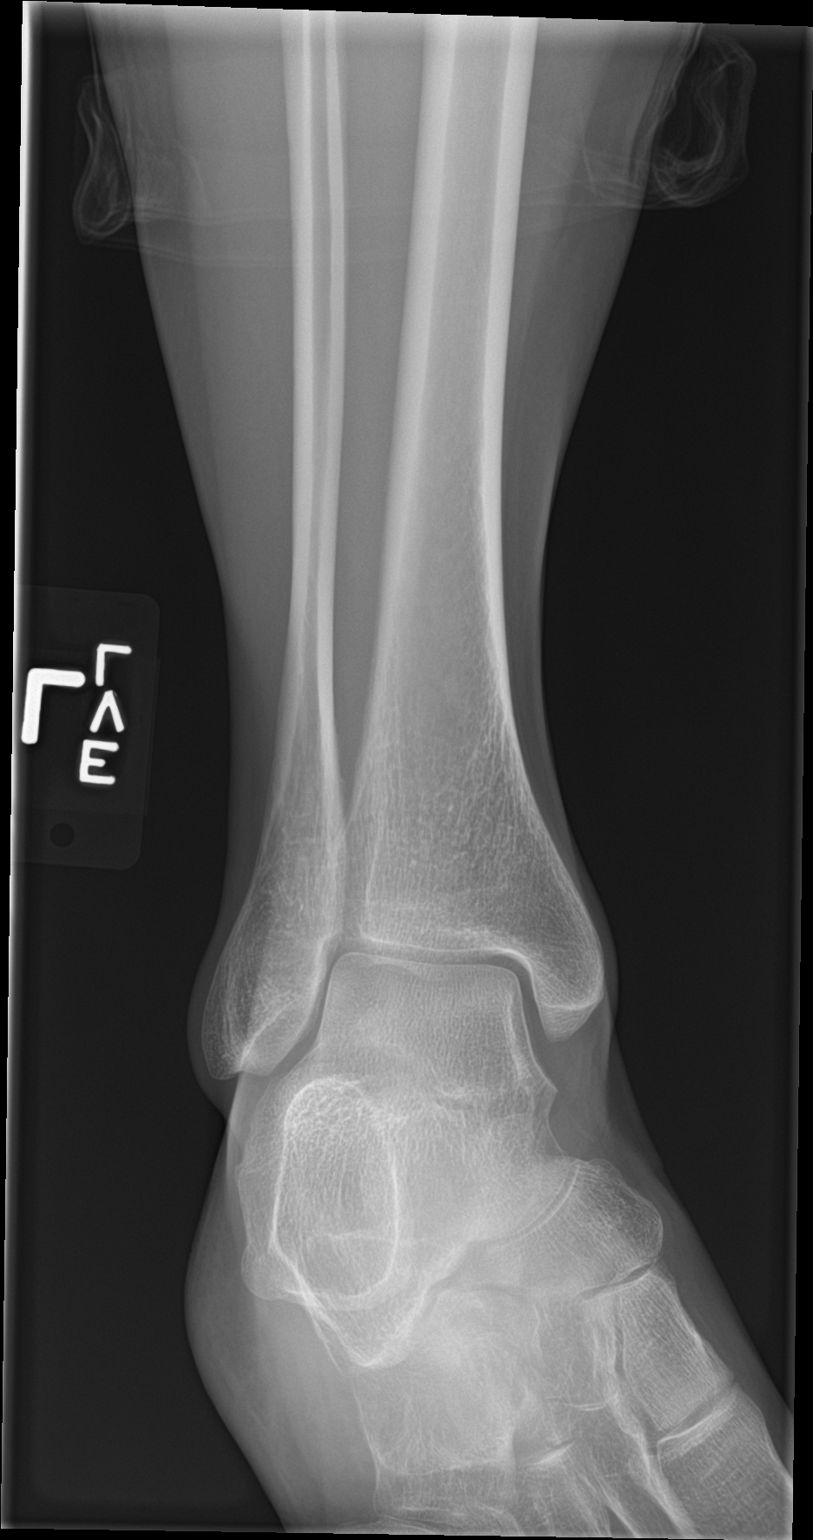

[ankle lat]
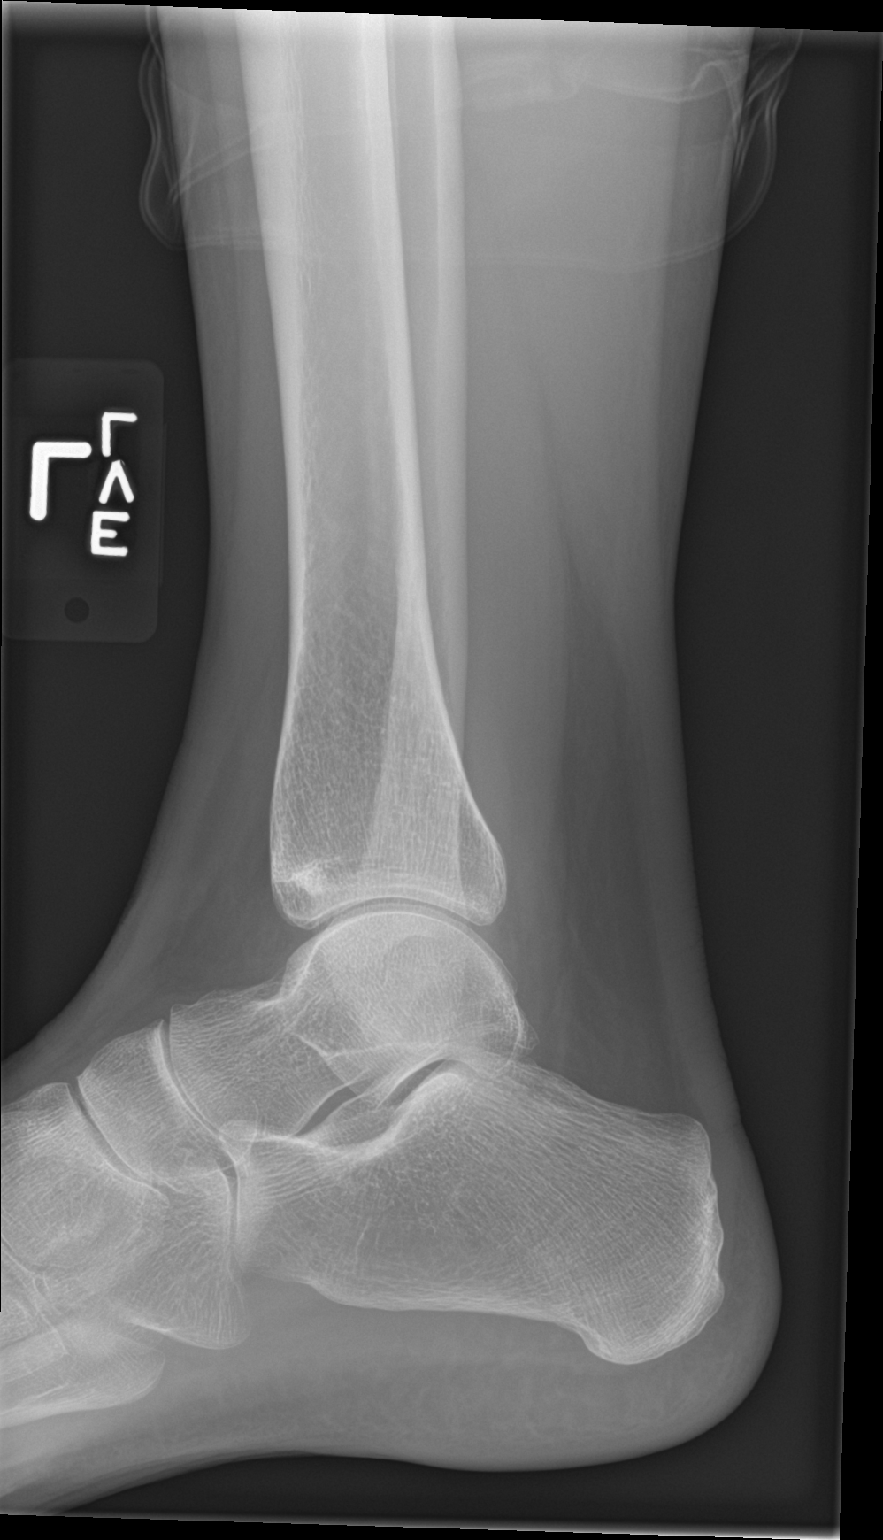

[3 of 3 positions shown; findings below may reference images not displayed]

FINDINGS: No fracture or dislocation. Joint spaces are preserved. The ankle
mortise is preserved. No ankle joint effusion. Regional soft tissues
appear normal. No radiopaque foreign body.
IMPRESSION: Normal radiographs of the right ankle.

## 2017-02-20 LAB — LIPID PANEL
Cholesterol: 186 mg/dL (ref 0–200)
HDL: 37 mg/dL (ref 35–70)
LDL Cholesterol: 119 mg/dL
LDl/HDL Ratio: 3.2
Triglycerides: 149 mg/dL (ref 40–160)

## 2017-02-20 LAB — VITAMIN D 25 HYDROXY (VIT D DEFICIENCY, FRACTURES): Vit D, 25-Hydroxy: 47.6

## 2017-02-25 ENCOUNTER — Encounter: Payer: Self-pay | Admitting: Internal Medicine

## 2017-09-05 ENCOUNTER — Ambulatory Visit (INDEPENDENT_AMBULATORY_CARE_PROVIDER_SITE_OTHER): Payer: 59 | Admitting: Internal Medicine

## 2017-09-05 ENCOUNTER — Encounter: Payer: Self-pay | Admitting: Internal Medicine

## 2017-09-05 VITALS — BP 118/64 | HR 67 | Temp 98.0°F | Resp 14 | Ht 63.0 in | Wt 117.2 lb

## 2017-09-05 DIAGNOSIS — M549 Dorsalgia, unspecified: Secondary | ICD-10-CM | POA: Diagnosis not present

## 2017-09-05 DIAGNOSIS — J40 Bronchitis, not specified as acute or chronic: Secondary | ICD-10-CM | POA: Diagnosis not present

## 2017-09-05 MED ORDER — AMOXICILLIN 500 MG PO CAPS: 1000.0000 mg | ORAL_CAPSULE | Freq: Two times a day (BID) | ORAL | 0 refills | Status: DC

## 2017-09-05 NOTE — Progress Notes (Signed)
Subjective:    Patient ID: Felicia Rollins, female    DOB: 1981-09-24, 36 y.o.   MRN: [id number]  DOS:  09/05/2017 Type of visit - description : Acute Interval history: Symptoms started 5 days ago with runny nose, sneezing, chest congestion. She is also coughing, mostly a dry cough  Taking Claritin and Mucinex w/ little help.  Also, has on and off left upper back pain. Previously taken ibuprofen or meloxicam or Flexeril. Flexeril cause some drowsiness so she takes it very rarely.   Review of Systems No fever chills No wheezing, no history of asthma or tobacco abuse. + Runny nose with some greenish discharge Her son has been sick w/a respiratory illness and had fever a few days ago. He is doing better. As far as the upper back pain, she request a refill medications, denies neck pain  Past Medical History:  Diagnosis Date  . H/O hypokalemia   . History of gastroenteritis   . Hyperlipidemia   . Muscle spasm    Frequent, keeps Mobic and Flexeril on hand  . Palpitations     Past Surgical History:  Procedure Laterality Date  . BREAST SURGERY     biopsy  . DILATION AND CURETTAGE OF UTERUS    . TONSILLECTOMY      Social History   Social History  . Marital status: Married    Spouse name: N/A  . Number of children: 2  . Years of education: N/A   Occupational History  . Futures trader    Social History Main Topics  . Smoking status: Never Smoker  . Smokeless tobacco: Never Used  . Alcohol use No     Comment: rarely  . Drug use: No  . Sexual activity: Yes   Other Topics Concern  . Not on file   Social History Narrative   3 and 6 y/o children      Allergies as of 09/05/2017      Reactions   Shellfish Allergy    Per allergy testing      Medication List       Accurate as of 09/05/17 11:59 PM. Always use your most recent med list.          amoxicillin 500 MG capsule Commonly known as:  AMOXIL Take 2 capsules (1,000 mg total) by mouth 2 (two) times  daily.   cyclobenzaprine 10 MG tablet Commonly known as:  FLEXERIL Take 1 tablet (10 mg total) by mouth as needed.   ibuprofen 200 MG tablet Commonly known as:  ADVIL,MOTRIN Take 800 mg by mouth every 6 (six) hours as needed for moderate pain.   meloxicam 15 MG tablet Commonly known as:  MOBIC Take 1 tablet by mouth as needed.   Vitamin D3 3000 units Tabs Take 1 tablet by mouth daily.            Discharge Care Instructions        Start     Ordered   09/05/17 0000  amoxicillin (AMOXIL) 500 MG capsule  2 times daily     09/05/17 1115         Objective:   Physical Exam  Musculoskeletal:       Arms:  BP 118/64 (BP Location: Left Arm, Patient Position: Sitting, Cuff Size: Small)   Pulse 67   Temp 98 F (36.7 C) (Oral)   Resp 14   Ht  (1.6 m)   Wt 117 lb 4 oz (53.2 kg)   LMP 09/05/2017 (Exact  Date)   SpO2 99%   BMI 20.77 kg/m  General:   Well developed, well nourished . NAD.  HEENT:  Normocephalic . Face symmetric, atraumatic. TMs normal. Nose not congested, sinuses no TTP. Throat symmetric, tonsils absent Lungs:  CTA B except for few rhonchi with cough Normal respiratory effort, no intercostal retractions, no accessory muscle use. Heart: RRR,  no murmur.  No pretibial edema bilaterally  Skin: Not pale. Not jaundice Neurologic:  alert & oriented X3.  Speech normal, gait appropriate for age and unassisted Psych--  Cognition and judgment appear intact.  Cooperative with normal attention span and concentration.  Behavior appropriate. No anxious or depressed appearing.      Assessment & Plan:  Assessment Neck spasms Palpitations neg Holter 08-2015 Vitamin D deficiency + FH CAD father early age Not on BCP  PLAN: Bronchitis: sx c/w bronchitis, rx conservative treatment with Mucinex, Flonase, rest and fluids. If not better will start amoxicillin. Upper back spasms, pain: Recommend stretching twice a day consistently, 3 modalities discussed. She  takes occasionally ibuprofen or Flexeril or meloxicam. Will call for a refill when needed

## 2017-09-05 NOTE — Progress Notes (Signed)
Pre visit review using our clinic review tool, if applicable. No additional management support is needed unless otherwise documented below in the visit note. 

## 2017-09-05 NOTE — Patient Instructions (Addendum)
Rest, fluids , tylenol  For cough:  Take Mucinex DM twice a day as needed until better  For nasal congestion: Use OTC  Flonase : 2 nasal sprays on each side of the nose in the morning until you feel better    Take the antibiotic as prescribed  (Amoxicillin) of no better in few days   Call if not gradually better over the next  10 days  Call anytime if the symptoms are severe

## 2017-09-07 NOTE — Assessment & Plan Note (Signed)
Bronchitis: sx c/w bronchitis, rx conservative treatment with Mucinex, Flonase, rest and fluids. If not better will start amoxicillin. Upper back spasms, pain: Recommend stretching twice a day consistently, 3 modalities discussed. She takes occasionally ibuprofen or Flexeril or meloxicam. Will call for a refill when needed

## 2017-10-17 ENCOUNTER — Ambulatory Visit: Payer: 59 | Admitting: Nurse Practitioner

## 2017-10-17 ENCOUNTER — Ambulatory Visit (INDEPENDENT_AMBULATORY_CARE_PROVIDER_SITE_OTHER): Payer: 59 | Admitting: Family

## 2017-10-17 ENCOUNTER — Encounter: Payer: Self-pay | Admitting: Family

## 2017-10-17 VITALS — BP 121/73 | HR 74 | Temp 98.1°F | Resp 18 | Ht 63.0 in | Wt 115.2 lb

## 2017-10-17 DIAGNOSIS — R21 Rash and other nonspecific skin eruption: Secondary | ICD-10-CM | POA: Diagnosis not present

## 2017-10-17 MED ORDER — CLOTRIMAZOLE-BETAMETHASONE 1-0.05 % EX CREA
1.0000 | TOPICAL_CREAM | Freq: Two times a day (BID) | CUTANEOUS | 0 refills | Status: DC
Start: 2017-10-17 — End: 2019-10-26

## 2017-10-17 NOTE — Progress Notes (Signed)
Subjective:    Patient ID: Felicia Rollins, female    DOB: 1981/04/05, 36 y.o.   MRN: [id number]  HPI  Felicia Rollins is a 36 yr old female who presents today with chief complaint of rash. Rash has been present for 3 weeks.  Has been using aveeno lotion, aquaphor and cortisone without improvement. Denies pain, blisters or fever. Reports that the rash is "just itchy."   Review of Systems See HPI  Past Medical History:  Diagnosis Date  . H/O hypokalemia   . History of gastroenteritis   . Hyperlipidemia   . Muscle spasm    Frequent, keeps Mobic and Flexeril on hand  . Palpitations      Social History   Social History  . Marital status: Married    Spouse name: N/A  . Number of children: 2  . Years of education: N/A   Occupational History  . Futures trader    Social History Main Topics  . Smoking status: Never Smoker  . Smokeless tobacco: Never Used  . Alcohol use No     Comment: rarely  . Drug use: No  . Sexual activity: Yes   Other Topics Concern  . Not on file   Social History Narrative   3 and 6 y/o children    Past Surgical History:  Procedure Laterality Date  . BREAST SURGERY     biopsy  . DILATION AND CURETTAGE OF UTERUS    . TONSILLECTOMY      Family History  Problem Relation Age of Onset  . Diabetes Father   . Heart attack Father        early onset (late 30s)  . Breast cancer Mother        breast  . Cancer Maternal Grandmother        liver  . Aortic stenosis Maternal Grandfather   . Diabetes Maternal Grandfather   . Kidney disease Maternal Grandfather        failure  . Diabetes Paternal Grandmother   . Heart attack Paternal Grandfather   . Breast cancer Paternal Aunt        breast    Allergies  Allergen Reactions  . Shellfish Allergy     Per allergy testing    Current Outpatient Prescriptions on File Prior to Visit  Medication Sig Dispense Refill  . Cholecalciferol (VITAMIN D3) 3000 UNITS TABS Take 1 tablet by mouth daily.    .  cyclobenzaprine (FLEXERIL) 10 MG tablet Take 1 tablet (10 mg total) by mouth as needed. 1 tablet 0  . ibuprofen (ADVIL,MOTRIN) 200 MG tablet Take 800 mg by mouth every 6 (six) hours as needed for moderate pain.     No current facility-administered medications on file prior to visit.     BP 121/73 (BP Location: Right Arm, Cuff Size: Normal)   Pulse 74   Temp 98.1 F (36.7 C) (Oral)   Resp 18   Ht 5\' 3"  (1.6 m)   Wt 115 lb 3.2 oz (52.3 kg)   LMP 09/29/2017   SpO2 100%   BMI 20.41 kg/m       Objective:   Physical Exam  Constitutional: She appears well-developed and well-nourished.  Cardiovascular: Normal rate, regular rhythm and normal heart sounds.   No murmur heard. Pulmonary/Chest: Effort normal and breath sounds normal. No respiratory distress. She has no wheezes.  Skin: Skin is warm and dry.  Raised erythematous rash right groin  Psychiatric: She has a normal mood and affect. Her behavior  is normal. Judgment and thought content normal.            Assessment & Plan:  Rash- ? Eczema versus fungal. Will give trial of lotrisone. Advised pt to let me know if not improved in 1-2 weeks and I will place referral to dermatology.

## 2017-10-17 NOTE — Patient Instructions (Signed)
Begin lotrisone cream twice daily to affected area.  Call if symptoms worsen or if they do not improve.

## 2017-10-31 ENCOUNTER — Other Ambulatory Visit: Payer: Self-pay | Admitting: Obstetrics and Gynecology

## 2017-10-31 DIAGNOSIS — Z1231 Encounter for screening mammogram for malignant neoplasm of breast: Secondary | ICD-10-CM

## 2017-11-28 ENCOUNTER — Ambulatory Visit: Payer: 59

## 2017-12-02 ENCOUNTER — Ambulatory Visit
Admission: RE | Admit: 2017-12-02 | Discharge: 2017-12-02 | Disposition: A | Discharge status: Home / Self Care | Payer: 59 | Source: Ambulatory Visit | Attending: Obstetrics and Gynecology | Admitting: Obstetrics and Gynecology

## 2017-12-02 DIAGNOSIS — Z1231 Encounter for screening mammogram for malignant neoplasm of breast: Secondary | ICD-10-CM

## 2018-03-11 LAB — LIPID PANEL
Cholesterol: 233 — AB (ref 0–200)
HDL: 40 (ref 35–70)
LDL Cholesterol: 165
LDl/HDL Ratio: 4.1
Triglycerides: 142 (ref 40–160)

## 2018-03-11 LAB — CBC AND DIFFERENTIAL
HCT: 37 (ref 36–46)
Hemoglobin: 11.7 — AB (ref 12.0–16.0)
Neutrophils Absolute: 4
Platelets: 241 (ref 150–399)
WBC: 6.1

## 2018-03-11 LAB — BASIC METABOLIC PANEL
BUN: 11 (ref 4–21)
Creatinine: 0.8 (ref <=1.1)
Glucose: 81
Potassium: 4 (ref 3.4–5.3)
Sodium: 137 (ref 137–147)

## 2018-03-11 LAB — CBC: RBC: 4.41 (ref 3.87–5.11)

## 2018-03-11 LAB — HEPATIC FUNCTION PANEL
ALT: 5 — AB (ref 7–35)
AST: 13 (ref 13–35)
Alkaline Phosphatase: 49 (ref 25–125)
Bilirubin, Total: 0.9

## 2018-03-11 LAB — TSH: TSH: 3.59 (ref <=5.90)

## 2018-03-11 LAB — VITAMIN D 25 HYDROXY (VIT D DEFICIENCY, FRACTURES): Vit D, 25-Hydroxy: 39.9

## 2019-07-30 ENCOUNTER — Encounter: Payer: Self-pay | Admitting: Internal Medicine

## 2019-09-14 ENCOUNTER — Other Ambulatory Visit: Payer: Self-pay | Admitting: Obstetrics and Gynecology

## 2019-09-14 DIAGNOSIS — Z1231 Encounter for screening mammogram for malignant neoplasm of breast: Secondary | ICD-10-CM

## 2019-09-24 ENCOUNTER — Ambulatory Visit: Payer: 59

## 2019-10-01 ENCOUNTER — Ambulatory Visit: Payer: Self-pay

## 2019-10-21 ENCOUNTER — Ambulatory Visit (INDEPENDENT_AMBULATORY_CARE_PROVIDER_SITE_OTHER): Payer: 59

## 2019-10-21 ENCOUNTER — Other Ambulatory Visit: Payer: Self-pay

## 2019-10-21 DIAGNOSIS — Z23 Encounter for immunization: Secondary | ICD-10-CM | POA: Diagnosis not present

## 2019-10-25 ENCOUNTER — Telehealth: Payer: Self-pay | Admitting: Internal Medicine

## 2019-10-25 NOTE — Telephone Encounter (Signed)
Pt has not been seen since 2018 by Dr. Larose Kells she will need an appt to discuss.

## 2019-10-25 NOTE — Telephone Encounter (Signed)
cyclobenzaprine (FLEXERIL) 10 MG tablet    Patient states this medication is too strong for her. It makes her feel drowsy. She inquired if she can try baclofen for reoccuring muscle spasms.    [id number] [address], [place] [phone] (Phone) [phone] (Fax

## 2019-10-26 ENCOUNTER — Encounter: Payer: Self-pay | Admitting: Internal Medicine

## 2019-10-26 ENCOUNTER — Other Ambulatory Visit: Payer: Self-pay

## 2019-10-26 ENCOUNTER — Ambulatory Visit (INDEPENDENT_AMBULATORY_CARE_PROVIDER_SITE_OTHER): Payer: 59 | Admitting: Internal Medicine

## 2019-10-26 DIAGNOSIS — M549 Dorsalgia, unspecified: Secondary | ICD-10-CM | POA: Diagnosis not present

## 2019-10-26 MED ORDER — BACLOFEN 10 MG PO TABS: 10.0000 mg | ORAL_TABLET | Freq: Three times a day (TID) | ORAL | 0 refills | Status: DC | PRN

## 2019-10-26 NOTE — Progress Notes (Signed)
Subjective:    Patient ID: Felicia Rollins, female    DOB: 11-24-1981, 38 y.o.   MRN: [id number]  DOS:  10/26/2019 Type of visit - description: Virtual Visit via Video Note  I connected with@   by a video enabled telemedicine application and verified that I am speaking with the correct person using two identifiers.   THIS ENCOUNTER IS A VIRTUAL VISIT DUE TO COVID-19 - PATIENT WAS NOT SEEN IN THE OFFICE. PATIENT HAS CONSENTED TO VIRTUAL VISIT / TELEMEDICINE VISIT   Location of patient: home  Location of provider: office  I discussed the limitations of evaluation and management by telemedicine and the availability of in person appointments. The patient expressed understanding and agreed to proceed.  History of Present Illness:  Acute History of upper back pain on and off throughout the years. This episode started few days ago and is worse on the right side. Previously tried Flexeril but that did not work for her, made her too groggy. Currently doing heating pad and ibuprofen with partial relief.   Review of Systems  Denies fever chills No injuries No rash No paresthesias She does spend many hours a week working on her computer.  Past Medical History:  Diagnosis Date  . H/O hypokalemia   . History of gastroenteritis   . Hyperlipidemia   . Muscle spasm    Frequent, keeps Mobic and Flexeril on hand  . Palpitations     Past Surgical History:  Procedure Laterality Date  . BREAST SURGERY     biopsy  . DILATION AND CURETTAGE OF UTERUS    . TONSILLECTOMY      Social History   Socioeconomic History  . Marital status: Married    Spouse name: Not on file  . Number of children: 2  . Years of education: Not on file  . Highest education level: Not on file  Occupational History  . Occupation: Arboriculturist  Social Needs  . Financial resource strain: Not on file  . Food insecurity    Worry: Not on file    Inability: Not on file  . Transportation needs   Medical: Not on file    Non-medical: Not on file  Tobacco Use  . Smoking status: Never Smoker  . Smokeless tobacco: Never Used  Substance and Sexual Activity  . Alcohol use: No    Alcohol/week: 0.0 standard drinks    Comment: rarely  . Drug use: No  . Sexual activity: Yes  Lifestyle  . Physical activity    Days per week: Not on file    Minutes per session: Not on file  . Stress: Not on file  Relationships  . Social Herbalist on phone: Not on file    Gets together: Not on file    Attends religious service: Not on file    Active member of club or organization: Not on file    Attends meetings of clubs or organizations: Not on file    Relationship status: Not on file  . Intimate partner violence    Fear of current or ex partner: Not on file    Emotionally abused: Not on file    Physically abused: Not on file    Forced sexual activity: Not on file  Other Topics Concern  . Not on file  Social History Narrative   3 and 59 y/o children      Allergies as of 10/26/2019      Reactions   Shellfish Allergy  Per allergy testing      Medication List       Accurate as of October 26, 2019 11:59 PM. If you have any questions, ask your nurse or doctor.        STOP taking these medications   clotrimazole-betamethasone cream Commonly known as: Lotrisone Stopped by: Willow Ora, MD   cyclobenzaprine 10 MG tablet Commonly known as: FLEXERIL Stopped by: Willow Ora, MD     TAKE these medications   baclofen 10 MG tablet Commonly known as: LIORESAL Take 1 tablet (10 mg total) by mouth 3 (three) times daily as needed for muscle spasms. Started by: Willow Ora, MD   ibuprofen 200 MG tablet Commonly known as: ADVIL Take 800 mg by mouth every 6 (six) hours as needed for moderate pain.   Vitamin D3 75 MCG (3000 UT) Tabs Take 1 tablet by mouth daily.           Objective:   Physical Exam There were no vitals taken for this visit. This is a virtual video visit, she is  alert oriented x3, in no apparent distress.  Move her head and neck without apparent problems    Assessment     Assessment Neck spasms Palpitations neg Holter 08-2015 Vitamin D deficiency + FH CAD father early age Not on BCP   PLAN: Upper back pain: Recurrent issue, continue using a heating pad, ibuprofen and Tylenol which help some . Also request a different muscle relaxant, cyclobenzaprine was too strong, make her sleepy, would like to try baclofen. Prescription sent, watch for drowsiness. Also discussed stretching, ergonomics.  See message. CPX: The patient gets labs regularly at work and will send a copy to me for comments.     I discussed the assessment and treatment plan with the patient. The patient was provided an opportunity to ask questions and all were answered. The patient agreed with the plan and demonstrated an understanding of the instructions.   The patient was advised to call back or seek an in-person evaluation if the symptoms worsen or if the condition fails to improve as anticipated.

## 2019-10-27 NOTE — Assessment & Plan Note (Signed)
Upper back pain: Recurrent issue, continue using a heating pad, ibuprofen and Tylenol which help some . Also request a different muscle relaxant, cyclobenzaprine was too strong, make her sleepy, would like to try baclofen. Prescription sent, watch for drowsiness. Also discussed stretching, ergonomics.  See message. CPX: The patient gets labs regularly at work and will send a copy to me for comments.

## 2019-11-08 ENCOUNTER — Encounter: Payer: Self-pay | Admitting: Internal Medicine

## 2019-11-08 ENCOUNTER — Ambulatory Visit (INDEPENDENT_AMBULATORY_CARE_PROVIDER_SITE_OTHER): Payer: 59 | Admitting: Internal Medicine

## 2019-11-08 ENCOUNTER — Other Ambulatory Visit: Payer: Self-pay

## 2019-11-08 VITALS — BP 112/74 | HR 92 | Temp 96.9°F | Resp 16 | Ht 63.0 in | Wt 113.5 lb

## 2019-11-08 DIAGNOSIS — M549 Dorsalgia, unspecified: Secondary | ICD-10-CM

## 2019-11-08 NOTE — Progress Notes (Signed)
Pre visit review using our clinic review tool, if applicable. No additional management support is needed unless otherwise documented below in the visit note. 

## 2019-11-08 NOTE — Patient Instructions (Signed)
Will  refer you to the orthopedic doctor

## 2019-11-08 NOTE — Progress Notes (Signed)
Subjective:    Patient ID: Felicia Rollins, female    DOB: 1981-07-24, 38 y.o.   MRN: [id number]  DOS:  11/08/2019 Type of visit - description: Acute See last visit, reports upper, right-sided back pain for the last 3 weeks, this type of pain typically resolves within few days however this time is lasting longer. When I saw her virtually, I prescribed baclofen, when she uses it along with rest, heating pad and ibuprofen she gets some relief however immediately after she started to do home chores the pain returns.  Although she does not have right shoulder pain she feels a click there.  Review of Systems No neck pain per se No rash at the upper torso.  No injury. No upper or lower extremity paresthesias  Past Medical History:  Diagnosis Date  . H/O hypokalemia   . History of gastroenteritis   . Hyperlipidemia   . Muscle spasm    Frequent, keeps Mobic and Flexeril on hand  . Palpitations     Past Surgical History:  Procedure Laterality Date  . BREAST SURGERY     biopsy  . DILATION AND CURETTAGE OF UTERUS    . TONSILLECTOMY      Social History   Socioeconomic History  . Marital status: Married    Spouse name: Not on file  . Number of children: 2  . Years of education: Not on file  . Highest education level: Not on file  Occupational History  . Occupation: Arboriculturist  Social Needs  . Financial resource strain: Not on file  . Food insecurity    Worry: Not on file    Inability: Not on file  . Transportation needs    Medical: Not on file    Non-medical: Not on file  Tobacco Use  . Smoking status: Never Smoker  . Smokeless tobacco: Never Used  Substance and Sexual Activity  . Alcohol use: No    Alcohol/week: 0.0 standard drinks    Comment: rarely  . Drug use: No  . Sexual activity: Yes  Lifestyle  . Physical activity    Days per week: Not on file    Minutes per session: Not on file  . Stress: Not on file  Relationships  . Social Herbalist  on phone: Not on file    Gets together: Not on file    Attends religious service: Not on file    Active member of club or organization: Not on file    Attends meetings of clubs or organizations: Not on file    Relationship status: Not on file  . Intimate partner violence    Fear of current or ex partner: Not on file    Emotionally abused: Not on file    Physically abused: Not on file    Forced sexual activity: Not on file  Other Topics Concern  . Not on file  Social History Narrative   3 and 77 y/o children      Allergies as of 11/08/2019      Reactions   Shellfish Allergy    Per allergy testing      Medication List       Accurate as of November 08, 2019  4:20 PM. If you have any questions, ask your nurse or doctor.        baclofen 10 MG tablet Commonly known as: LIORESAL Take 1 tablet (10 mg total) by mouth 3 (three) times daily as needed for muscle spasms.  ibuprofen 200 MG tablet Commonly known as: ADVIL Take 800 mg by mouth every 6 (six) hours as needed for moderate pain.   Vitamin D3 75 MCG (3000 UT) Tabs Take 1 tablet by mouth daily.           Objective:   Physical Exam Neck:     BP 112/74 (BP Location: Left Arm, Patient Position: Sitting, Cuff Size: Small)   Pulse 92   Temp (!) 96.9 F (36.1 C) (Temporal)   Resp 16   Ht 5\' 3"  (1.6 m)   Wt 113 lb 8 oz (51.5 kg)   LMP 10/25/2019 (Approximate)   SpO2 100%   BMI 20.11 kg/m  General:   Well developed, NAD, BMI noted. HEENT:  Normocephalic . Face symmetric, atraumatic Neck: No TTP at the cervical spine, range of motion normal. Shoulders: Symmetric, range of motion normal.  She does have a "click" when I passively move her right shoulder. Lungs:  CTA B Normal respiratory effort, no intercostal retractions, no accessory muscle use. Heart: RRR,  no murmur.  No pretibial edema bilaterally  Skin: Not pale. Not jaundice Neurologic:  alert & oriented X3.  Speech normal, gait appropriate for age  and unassisted.  DTRs and motor symmetric. Psych--  Cognition and judgment appear intact.  Cooperative with normal attention span and concentration.  Behavior appropriate. No anxious or depressed appearing.      Assessment     Assessment Neck spasms Palpitations neg Holter 08-2015 Vitamin D deficiency + FH CAD father early age Not on BCP   PLAN: Upper back pain: Symptoms going on for 3 weeks, neuro exam normal, I talk  about prescribing a steroid, she is somewhat hesitant. At the end, we agreed on a Ortho referral, for now continue baclofen and a heating pad. Underweight appearing: Since the last time I saw her in person a couple of years ago she has lost some weight, although she looks healthy she seems to be slightly underweight.  She is going vegetarian because her LDL cholesterol is slightly elevated and she has a family history of CAD. She will send me her lab results.  She might need statins.

## 2019-11-10 NOTE — Assessment & Plan Note (Signed)
Upper back pain: Symptoms going on for 3 weeks, neuro exam normal, I talk  about prescribing a steroid, she is somewhat hesitant. At the end, we agreed on a Ortho referral, for now continue baclofen and a heating pad. Underweight appearing: Since the last time I saw her in person a couple of years ago she has lost some weight, although she looks healthy she seems to be slightly underweight.  She is going vegetarian because her LDL cholesterol is slightly elevated and she has a family history of CAD. She will send me her lab results.  She might need statins.

## 2019-11-15 ENCOUNTER — Ambulatory Visit
Admission: RE | Admit: 2019-11-15 | Discharge: 2019-11-15 | Disposition: A | Discharge status: Home / Self Care | Payer: 59 | Source: Ambulatory Visit | Attending: Obstetrics and Gynecology | Admitting: Obstetrics and Gynecology

## 2019-11-15 ENCOUNTER — Other Ambulatory Visit: Payer: Self-pay

## 2019-11-15 DIAGNOSIS — Z1231 Encounter for screening mammogram for malignant neoplasm of breast: Secondary | ICD-10-CM

## 2019-11-19 ENCOUNTER — Telehealth: Payer: Self-pay | Admitting: Internal Medicine

## 2019-11-19 MED ORDER — BACLOFEN 10 MG PO TABS: 10.0000 mg | ORAL_TABLET | Freq: Three times a day (TID) | ORAL | 0 refills | Status: DC | PRN

## 2019-11-19 NOTE — Telephone Encounter (Signed)
Rx sent 

## 2019-11-19 NOTE — Telephone Encounter (Signed)
Patient requesting baclofen (LIORESAL) 10 MG tablet refill. orthopedic just reached out to patient today to schedule appointment   [id number] - [place], [place]

## 2020-06-05 ENCOUNTER — Encounter: Payer: Self-pay | Admitting: Family Medicine

## 2020-06-05 ENCOUNTER — Other Ambulatory Visit: Payer: Self-pay

## 2020-06-05 ENCOUNTER — Ambulatory Visit (INDEPENDENT_AMBULATORY_CARE_PROVIDER_SITE_OTHER): Payer: 59 | Admitting: Family Medicine

## 2020-06-05 VITALS — BP 110/70 | HR 80 | Temp 97.0°F | Resp 18 | Ht 63.0 in | Wt 118.4 lb

## 2020-06-05 DIAGNOSIS — R42 Dizziness and giddiness: Secondary | ICD-10-CM

## 2020-06-05 MED ORDER — MECLIZINE HCL 25 MG PO TABS: 25.0000 mg | ORAL_TABLET | Freq: Three times a day (TID) | ORAL | 0 refills | Status: DC | PRN

## 2020-06-05 NOTE — Patient Instructions (Signed)

## 2020-06-05 NOTE — Progress Notes (Signed)
Patient ID: Felicia Rollins, female    DOB: 1981-05-31  Age: 39 y.o. MRN: [id number]    Subjective:  Subjective  HPI Felicia Rollins presents for dizziness since last week ---  It is better today.  Episodes last about an hour.  There is a fam hx vertigo.  No congestion,   She had 1 episode of pain r ear for a min but it is gone and did not come back.    No cp, no sob, no palpitations   No fever   Review of Systems  Constitutional: Negative for appetite change, diaphoresis, fatigue and unexpected weight change.  HENT: Negative.   Eyes: Negative for pain, redness and visual disturbance.  Respiratory: Negative for cough, chest tightness, shortness of breath and wheezing.   Cardiovascular: Negative for chest pain, palpitations and leg swelling.  Endocrine: Negative for cold intolerance, heat intolerance, polydipsia, polyphagia and polyuria.  Genitourinary: Negative for difficulty urinating, dysuria and frequency.  Neurological: Negative for dizziness, light-headedness, numbness and headaches.  Psychiatric/Behavioral: Negative.     History Past Medical History:  Diagnosis Date  . H/O hypokalemia   . History of gastroenteritis   . Hyperlipidemia   . Muscle spasm    Frequent, keeps Mobic and Flexeril on hand  . Palpitations     She has a past surgical history that includes Breast surgery; Dilation and curettage of uterus; and Tonsillectomy.   Her family history includes Aortic stenosis in her maternal grandfather; Breast cancer in her mother and paternal aunt; Cancer in her maternal grandmother; Diabetes in her father, maternal grandfather, and paternal grandmother; Heart attack in her father and paternal grandfather; Kidney disease in her maternal grandfather.She reports that she has never smoked. She has never used smokeless tobacco. She reports that she does not drink alcohol or use drugs.  Current Outpatient Medications on File Prior to Visit  Medication Sig Dispense Refill  .  baclofen (LIORESAL) 10 MG tablet Take 1 tablet (10 mg total) by mouth 3 (three) times daily as needed for muscle spasms. 40 each 0  . Cholecalciferol (VITAMIN D3) 3000 UNITS TABS Take 1 tablet by mouth daily.    Marland Kitchen ibuprofen (ADVIL,MOTRIN) 200 MG tablet Take 800 mg by mouth every 6 (six) hours as needed for moderate pain.    Marland Kitchen triamcinolone ointment (KENALOG) 0.1 % Apply 1 application topically 2 (two) times daily.     No current facility-administered medications on file prior to visit.     Objective:  Objective  Physical Exam Vitals and nursing note reviewed.  Constitutional:      Appearance: She is well-developed.  HENT:     Head: Normocephalic and atraumatic.     Right Ear: Hearing, tympanic membrane, ear canal and external ear normal.     Left Ear: Hearing, tympanic membrane, ear canal and external ear normal.     Ears:   Eyes:     Conjunctiva/sclera: Conjunctivae normal.  Neck:     Thyroid: No thyromegaly.     Vascular: No carotid bruit or JVD.  Cardiovascular:     Rate and Rhythm: Normal rate and regular rhythm.     Heart sounds: Normal heart sounds. No murmur.  Pulmonary:     Effort: Pulmonary effort is normal. No respiratory distress.     Breath sounds: Normal breath sounds. No wheezing or rales.  Chest:     Chest wall: No tenderness.  Musculoskeletal:     Cervical back: Normal range of motion and neck supple.  Neurological:  Mental Status: She is alert and oriented to person, place, and time.    BP 110/70 (BP Location: Right Arm, Patient Position: Sitting, Cuff Size: Normal)   Pulse 80   Temp (!) 97 F (36.1 C) (Temporal)   Resp 18   Ht 5\' 3"  (1.6 m)   Wt 118 lb 6.4 oz (53.7 kg)   SpO2 100%   BMI 20.97 kg/m  Wt Readings from Last 3 Encounters:  06/05/20 118 lb 6.4 oz (53.7 kg)  11/08/19 113 lb 8 oz (51.5 kg)  10/17/17 115 lb 3.2 oz (52.3 kg)     Lab Results  Component Value Date   WBC 6.1 03/11/2018   HGB 11.7 (A) 03/11/2018   HCT 37 03/11/2018     PLT 241 03/11/2018   GLUCOSE 83 09/20/2015   CHOL 233 (A) 03/11/2018   TRIG 142 03/11/2018   HDL 40 03/11/2018   LDLDIRECT 152.7 03/02/2010   LDLCALC 165 03/11/2018   ALT 5 (A) 03/11/2018   AST 13 03/11/2018   NA 137 03/11/2018   K 4.0 03/11/2018   CL 103 09/20/2015   CREATININE 0.8 03/11/2018   BUN 11 03/11/2018   CO2 30 09/20/2015   TSH 3.59 03/11/2018    MM 3D SCREEN BREAST BILATERAL  Result Date: 11/15/2019 CLINICAL DATA:  Screening. EXAM: DIGITAL SCREENING BILATERAL MAMMOGRAM WITH TOMO AND CAD COMPARISON:  Previous exam(s). ACR Breast Density Category d: The breast tissue is extremely dense, which lowers the sensitivity of mammography FINDINGS: There are no findings suspicious for malignancy. Images were processed with CAD. IMPRESSION: No mammographic evidence of malignancy. A result letter of this screening mammogram will be mailed directly to the patient. RECOMMENDATION: Screening mammogram in one year. (Code:SM-B-01Y) BI-RADS CATEGORY  1: Negative. Electronically Signed   By: 11/17/2019 M.D.   On: 11/15/2019 15:22     Assessment & Plan:  Plan  I am having Felicia Rollins start on meclizine. I am also having her maintain her Vitamin D3, ibuprofen, baclofen, and triamcinolone ointment.  Meds ordered this encounter  Medications  . meclizine (ANTIVERT) 25 MG tablet    Sig: Take 1 tablet (25 mg total) by mouth 3 (three) times daily as needed for dizziness.    Dispense:  30 tablet    Refill:  0    Problem List Items Addressed This Visit    None    Visit Diagnoses    Dizzy    -  Primary   Relevant Medications   meclizine (ANTIVERT) 25 MG tablet   Other Relevant Orders   CBC with Differential/Platelet   Vitamin B12   TSH   Comprehensive metabolic panel   Vitamin D (25 hydroxy)    epley manuver ho given to pt Check labs  F/u if no improvement   Follow-up: Return if symptoms worsen or fail to improve.  11/17/2019, DO

## 2020-06-08 ENCOUNTER — Other Ambulatory Visit: Payer: Self-pay

## 2020-06-08 ENCOUNTER — Other Ambulatory Visit (INDEPENDENT_AMBULATORY_CARE_PROVIDER_SITE_OTHER): Payer: 59

## 2020-06-08 DIAGNOSIS — R42 Dizziness and giddiness: Secondary | ICD-10-CM

## 2020-06-08 NOTE — Addendum Note (Signed)
Addended by: Mervin Kung A on: 06/08/2020 03:04 PM   Modules accepted: Orders

## 2020-06-09 ENCOUNTER — Other Ambulatory Visit: Payer: Self-pay | Admitting: Family Medicine

## 2020-06-09 DIAGNOSIS — E039 Hypothyroidism, unspecified: Secondary | ICD-10-CM

## 2020-06-09 LAB — CBC WITH DIFFERENTIAL/PLATELET
Basophils Absolute: 0.1 10*3/uL (ref 0.0–0.1)
Basophils Relative: 1.3 % (ref 0.0–3.0)
Eosinophils Absolute: 0.1 10*3/uL (ref 0.0–0.7)
Eosinophils Relative: 1.5 % (ref 0.0–5.0)
HCT: 34.1 % — ABNORMAL LOW (ref 36.0–46.0)
Hemoglobin: 11.3 g/dL — ABNORMAL LOW (ref 12.0–15.0)
Lymphocytes Relative: 31.2 % (ref 12.0–46.0)
Lymphs Abs: 1.6 10*3/uL (ref 0.7–4.0)
MCHC: 33.2 g/dL (ref 30.0–36.0)
MCV: 81.4 fl (ref 78.0–100.0)
Monocytes Absolute: 0.4 10*3/uL (ref 0.1–1.0)
Monocytes Relative: 7.8 % (ref 3.0–12.0)
Neutro Abs: 3 10*3/uL (ref 1.4–7.7)
Neutrophils Relative %: 58.2 % (ref 43.0–77.0)
Platelets: 242 10*3/uL (ref 150.0–400.0)
RBC: 4.19 Mil/uL (ref 3.87–5.11)
RDW: 14.4 % (ref 11.5–15.5)
WBC: 5.1 10*3/uL (ref 4.0–10.5)

## 2020-06-09 LAB — COMPREHENSIVE METABOLIC PANEL
ALT: 13 U/L (ref 0–35)
AST: 17 U/L (ref 0–37)
Albumin: 4.9 g/dL (ref 3.5–5.2)
Alkaline Phosphatase: 45 U/L (ref 39–117)
BUN: 12 mg/dL (ref 6–23)
CO2: 30 mEq/L (ref 19–32)
Calcium: 9.7 mg/dL (ref 8.4–10.5)
Chloride: 102 mEq/L (ref 96–112)
Creatinine, Ser: 0.78 mg/dL (ref 0.40–1.20)
GFR: 81.42 mL/min (ref >60.00)
Glucose, Bld: 84 mg/dL (ref 70–99)
Potassium: 3.9 mEq/L (ref 3.5–5.1)
Sodium: 138 mEq/L (ref 135–145)
Total Bilirubin: 0.3 mg/dL (ref 0.2–1.2)
Total Protein: 7.4 g/dL (ref 6.0–8.3)

## 2020-06-09 LAB — VITAMIN B12: Vitamin B-12: 324 pg/mL (ref 211–911)

## 2020-06-09 LAB — TSH: TSH: 5.77 u[IU]/mL — ABNORMAL HIGH (ref 0.35–4.50)

## 2020-06-09 LAB — VITAMIN D 25 HYDROXY (VIT D DEFICIENCY, FRACTURES): VITD: 33.61 ng/mL (ref 30.00–100.00)

## 2020-06-21 ENCOUNTER — Other Ambulatory Visit: Payer: 59

## 2020-06-23 ENCOUNTER — Other Ambulatory Visit (INDEPENDENT_AMBULATORY_CARE_PROVIDER_SITE_OTHER): Payer: 59

## 2020-06-23 ENCOUNTER — Other Ambulatory Visit: Payer: Self-pay

## 2020-06-23 DIAGNOSIS — E039 Hypothyroidism, unspecified: Secondary | ICD-10-CM | POA: Diagnosis not present

## 2020-06-24 ENCOUNTER — Other Ambulatory Visit: Payer: Self-pay | Admitting: Family Medicine

## 2020-06-24 DIAGNOSIS — E039 Hypothyroidism, unspecified: Secondary | ICD-10-CM

## 2020-06-24 LAB — THYROID PANEL WITH TSH
Free Thyroxine Index: 1.4 (ref 1.4–3.8)
T3 Uptake: 28 % (ref 22–35)
T4, Total: 5 ug/dL — ABNORMAL LOW (ref 5.1–11.9)
TSH: 5.25 mIU/L — ABNORMAL HIGH

## 2020-06-28 ENCOUNTER — Other Ambulatory Visit: Payer: Self-pay

## 2020-06-28 MED ORDER — LEVOTHYROXINE SODIUM 25 MCG PO TABS: 25.0000 ug | ORAL_TABLET | Freq: Every day | ORAL | 2 refills | Status: DC

## 2020-09-18 ENCOUNTER — Telehealth: Payer: Self-pay | Admitting: Internal Medicine

## 2020-09-18 NOTE — Telephone Encounter (Signed)
Disregard note, patient is coming in tomorrow for labs.

## 2020-09-18 NOTE — Telephone Encounter (Signed)
Medication:levothyroxine (SYNTHROID) 25 MCG tablet [[id number]]    Has the patient contacted their pharmacy? No. (If no, request that the patient contact the pharmacy for the refill.) (If yes, when and what did the pharmacy advise?)  Preferred Pharmacy (with phone number or street name): [id number] - [place], [place] - [address]  [address] [place] [address]  Phone:  [phone] Fax:  [phone]  DEA #:  [id number]  Agent: Please be advised that RX refills may take up to 3 business days. We ask that you follow-up with your pharmacy.

## 2020-09-19 ENCOUNTER — Other Ambulatory Visit (INDEPENDENT_AMBULATORY_CARE_PROVIDER_SITE_OTHER): Payer: 59

## 2020-09-19 ENCOUNTER — Other Ambulatory Visit: Payer: Self-pay | Admitting: Family Medicine

## 2020-09-19 ENCOUNTER — Other Ambulatory Visit: Payer: Self-pay

## 2020-09-19 DIAGNOSIS — E039 Hypothyroidism, unspecified: Secondary | ICD-10-CM

## 2020-09-19 NOTE — Telephone Encounter (Signed)
Felicia Rollins -- Pt wants to wait on refill until results are back to see if dose will need to change. She only has about 4 days of medication left and wants someone to follow up with her on result and refill before she runs out of medication.

## 2020-09-19 NOTE — Telephone Encounter (Signed)
Lab appt this afternoon. Waiting results.

## 2020-09-20 LAB — THYROID PANEL WITH TSH
Free Thyroxine Index: 1.9 (ref 1.4–3.8)
T3 Uptake: 29 % (ref 22–35)
T4, Total: 6.6 ug/dL (ref 5.1–11.9)
TSH: 4.31 mIU/L

## 2020-10-03 ENCOUNTER — Telehealth: Payer: Self-pay | Admitting: Internal Medicine

## 2020-10-03 NOTE — Telephone Encounter (Signed)
Caller Felicia Rollins  Call Back # [phone]  Patient is requesting a call back, patient would like to discuss why her appointment is needed for tomorrow. She just saw Dr Laury Axon in June 2021.   Please Advise

## 2020-10-03 NOTE — Telephone Encounter (Signed)
Mychart message sent.

## 2020-10-04 ENCOUNTER — Encounter: Payer: Self-pay | Admitting: Internal Medicine

## 2020-10-04 ENCOUNTER — Other Ambulatory Visit: Payer: Self-pay

## 2020-10-04 ENCOUNTER — Ambulatory Visit (INDEPENDENT_AMBULATORY_CARE_PROVIDER_SITE_OTHER): Payer: 59 | Admitting: Internal Medicine

## 2020-10-04 VITALS — BP 117/76 | HR 86 | Temp 98.3°F | Resp 16 | Ht 63.0 in | Wt 116.1 lb

## 2020-10-04 DIAGNOSIS — D649 Anemia, unspecified: Secondary | ICD-10-CM | POA: Diagnosis not present

## 2020-10-04 DIAGNOSIS — E039 Hypothyroidism, unspecified: Secondary | ICD-10-CM | POA: Diagnosis not present

## 2020-10-04 DIAGNOSIS — Z0000 Encounter for general adult medical examination without abnormal findings: Secondary | ICD-10-CM | POA: Diagnosis not present

## 2020-10-04 DIAGNOSIS — Z23 Encounter for immunization: Secondary | ICD-10-CM | POA: Diagnosis not present

## 2020-10-04 NOTE — Progress Notes (Signed)
Subjective:    Patient ID: Felicia Rollins, female    DOB: 12-Jun-1981, 39 y.o.   MRN: [id number]  DOS:  10/04/2020 Type of visit - description: CPX In general feels well. Recently seen with dizziness, symptoms resolved. Recently diagnosed hypothyroidism, on Synthroid.  Wt Readings from Last 3 Encounters:  10/04/20 116 lb 2 oz (52.7 kg)  06/05/20 118 lb 6.4 oz (53.7 kg)  11/08/19 113 lb 8 oz (51.5 kg)     Review of Systems Denies fever chills or unusual weight fluctuation. No nausea, vomiting, diarrhea.  No blood in the stools, no constipation. No Anxiety depression Menses are heavy, monthly, last 5 to 6 days  Other than above, a 14 point review of systems is negative       Past Medical History:  Diagnosis Date  . H/O hypokalemia   . History of gastroenteritis   . Hyperlipidemia   . Muscle spasm    Frequent, keeps Mobic and Flexeril on hand  . Palpitations     Past Surgical History:  Procedure Laterality Date  . BREAST SURGERY     biopsy  . DILATION AND CURETTAGE OF UTERUS    . TONSILLECTOMY      Allergies as of 10/04/2020      Reactions   Shellfish Allergy    Per allergy testing      Medication List       Accurate as of October 04, 2020  9:48 PM. If you have any questions, ask your nurse or doctor.        STOP taking these medications   baclofen 10 MG tablet Commonly known as: LIORESAL Stopped by: Willow Ora, MD   ibuprofen 200 MG tablet Commonly known as: ADVIL Stopped by: Willow Ora, MD   meclizine 25 MG tablet Commonly known as: ANTIVERT Stopped by: Willow Ora, MD   triamcinolone ointment 0.1 % Commonly known as: KENALOG Stopped by: Willow Ora, MD     TAKE these medications   cholecalciferol 25 MCG (1000 UNIT) tablet Commonly known as: VITAMIN D3 Take 1,000 Units by mouth daily. What changed: Another medication with the same name was removed. Continue taking this medication, and follow the directions you see here. Changed by: Willow Ora, MD    levothyroxine 25 MCG tablet Commonly known as: SYNTHROID TAKE 1 TABLET BY MOUTH DAILY BEFORE BREAKFAST.   multivitamin with minerals Tabs tablet Take 1 tablet by mouth daily.          Objective:   Physical Exam BP 117/76 (BP Location: Left Arm, Patient Position: Sitting, Cuff Size: Small)   Pulse 86   Temp 98.3 F (36.8 C) (Oral)   Resp 16   Ht 5\' 3"  (1.6 m)   Wt 116 lb 2 oz (52.7 kg)   LMP 09/20/2020 (Approximate)   SpO2 100%   BMI 20.57 kg/m  General: Well developed, NAD, BMI noted Neck: No  thyromegaly  HEENT:  Normocephalic . Face symmetric, atraumatic Lungs:  CTA B Normal respiratory effort, no intercostal retractions, no accessory muscle use. Heart: RRR,  no murmur.  Abdomen:  Not distended, soft, non-tender. No rebound or rigidity.   Lower extremities: no pretibial edema bilaterally  Skin: Exposed areas without rash. Not pale. Not jaundice Neurologic:  alert & oriented X3.  Speech normal, gait appropriate for age and unassisted Strength symmetric and appropriate for age.  Psych: Cognition and judgment appear intact.  Cooperative with normal attention span and concentration.  Behavior appropriate. No anxious or depressed appearing.  Assessment      Assessment Mild hypothyroidism (Rx levothyroxine 05/2020) Palpitations neg Holter 08-2015 Vitamin D deficiency + FH CAD father early age + FH of breast cancer mother early age Not on BCP (condoms)   PLAN: Here for CPX Vitamin D deficiency: On a multivitamin and 1000 units of D3 daily, last level satisfactory. Hypothyroidism: Had 2 consecutive TSH elevation  ~ 5.2.  Started Synthroid 25 mcg, follow-up TSH was 4.3. Plan: Continue levothyroxine, check TSH in 1 month. Anemia: Mild, likely from menses, no GI symptoms.  Rechecking a CBC and iron TIBC and ferritin.  Consider iron supplements in addition to her multivitamin. Pt considering switch to Dr. Laury Axon at some point RTC labs 1 month RTC CPX 1 year    This visit occurred during the SARS-CoV-2 public health emergency.  Safety protocols were in place, including screening questions prior to the visit, additional usage of staff PPE, and extensive cleaning of exam room while observing appropriate contact time as indicated for disinfecting solutions.

## 2020-10-04 NOTE — Patient Instructions (Signed)
  GO TO THE FRONT DESK, PLEASE SCHEDULE YOUR APPOINTMENTS Come back for blood work only in 1 month, fasting  Come back for a physical exam in 1 year

## 2020-10-04 NOTE — Assessment & Plan Note (Signed)
Td 2012 Had COVID vaccination 02-2020, watch for booster instruction from CDC Flu shot today Female care per Dr. Senaida Ores + FH breast cancer, utd on  [facility] Labs: Will come back fasting for FLP, CBC, TSH, iron, ferritin, TIBC. Lifestyle: Eat healthy, stays active.

## 2020-10-04 NOTE — Assessment & Plan Note (Signed)
Here for CPX Vitamin D deficiency: On a multivitamin and 1000 units of D3 daily, last level satisfactory. Hypothyroidism: Had 2 consecutive TSH elevation  ~ 5.2.  Started Synthroid 25 mcg, follow-up TSH was 4.3. Plan: Continue levothyroxine, check TSH in 1 month. Anemia: Mild, likely from menses, no GI symptoms.  Rechecking a CBC and iron TIBC and ferritin.  Consider iron supplements in addition to her multivitamin. Pt considering switch to Dr. Laury Axon at some point RTC labs 1 month RTC CPX 1 year

## 2020-10-04 NOTE — Progress Notes (Signed)
Pre visit review using our clinic review tool, if applicable. No additional management support is needed unless otherwise documented below in the visit note. 

## 2020-10-13 ENCOUNTER — Other Ambulatory Visit: Payer: Self-pay | Admitting: Obstetrics and Gynecology

## 2020-10-13 DIAGNOSIS — Z0000 Encounter for general adult medical examination without abnormal findings: Secondary | ICD-10-CM

## 2020-11-02 NOTE — Addendum Note (Signed)
Addended by: Mervin Kung A on: 11/02/2020 02:59 PM   Modules accepted: Orders

## 2020-11-02 NOTE — Addendum Note (Signed)
Addended by: Mervin Kung A on: 11/02/2020 03:00 PM   Modules accepted: Orders

## 2020-11-02 NOTE — Addendum Note (Signed)
Addended by: Viana Sleep A on: 11/02/2020 03:03 PM   Modules accepted: Orders  

## 2020-11-03 ENCOUNTER — Other Ambulatory Visit: Payer: 59

## 2020-11-08 ENCOUNTER — Other Ambulatory Visit (INDEPENDENT_AMBULATORY_CARE_PROVIDER_SITE_OTHER): Payer: 59

## 2020-11-08 ENCOUNTER — Other Ambulatory Visit: Payer: Self-pay

## 2020-11-08 DIAGNOSIS — Z0000 Encounter for general adult medical examination without abnormal findings: Secondary | ICD-10-CM | POA: Diagnosis not present

## 2020-11-08 DIAGNOSIS — E039 Hypothyroidism, unspecified: Secondary | ICD-10-CM | POA: Diagnosis not present

## 2020-11-08 DIAGNOSIS — D649 Anemia, unspecified: Secondary | ICD-10-CM | POA: Diagnosis not present

## 2020-11-08 LAB — CBC WITH DIFFERENTIAL/PLATELET
Basophils Absolute: 0 10*3/uL (ref 0.0–0.1)
Basophils Relative: 0.5 % (ref 0.0–3.0)
Eosinophils Absolute: 0.1 10*3/uL (ref 0.0–0.7)
Eosinophils Relative: 1.7 % (ref 0.0–5.0)
HCT: 37.1 % (ref 36.0–46.0)
Hemoglobin: 12.3 g/dL (ref 12.0–15.0)
Lymphocytes Relative: 26.1 % (ref 12.0–46.0)
Lymphs Abs: 1.2 10*3/uL (ref 0.7–4.0)
MCHC: 33.1 g/dL (ref 30.0–36.0)
MCV: 85.3 fl (ref 78.0–100.0)
Monocytes Absolute: 0.5 10*3/uL (ref 0.1–1.0)
Monocytes Relative: 9.9 % (ref 3.0–12.0)
Neutro Abs: 2.9 10*3/uL (ref 1.4–7.7)
Neutrophils Relative %: 61.8 % (ref 43.0–77.0)
Platelets: 205 10*3/uL (ref 150.0–400.0)
RBC: 4.35 Mil/uL (ref 3.87–5.11)
RDW: 14.2 % (ref 11.5–15.5)
WBC: 4.8 10*3/uL (ref 4.0–10.5)

## 2020-11-08 LAB — IBC PANEL
Iron: 73 ug/dL (ref 42–145)
Saturation Ratios: 17.6 % — ABNORMAL LOW (ref 20.0–50.0)
Transferrin: 296 mg/dL (ref 212.0–360.0)

## 2020-11-08 LAB — LIPID PANEL
Cholesterol: 202 mg/dL — ABNORMAL HIGH (ref 0–200)
HDL: 35.7 mg/dL — ABNORMAL LOW (ref >39.00)
LDL Cholesterol: 133 mg/dL — ABNORMAL HIGH (ref 0–99)
NonHDL: 166.48
Total CHOL/HDL Ratio: 6
Triglycerides: 165 mg/dL — ABNORMAL HIGH (ref 0.0–149.0)
VLDL: 33 mg/dL (ref 0.0–40.0)

## 2020-11-08 LAB — TSH: TSH: 4.53 u[IU]/mL — ABNORMAL HIGH (ref 0.35–4.50)

## 2020-11-08 LAB — FERRITIN: Ferritin: 8 ng/mL — ABNORMAL LOW (ref 10.0–291.0)

## 2020-11-09 MED ORDER — LEVOTHYROXINE SODIUM 50 MCG PO TABS: 50.0000 ug | ORAL_TABLET | Freq: Every day | ORAL | 3 refills | Status: DC

## 2020-11-09 MED ORDER — FERROUS FUMARATE 325 (106 FE) MG PO TABS: 1.0000 | ORAL_TABLET | Freq: Every day | ORAL | 6 refills | Status: DC

## 2020-11-09 NOTE — Addendum Note (Signed)
Addended byConrad [place] D on: 11/09/2020 04:50 PM   Modules accepted: Orders

## 2020-11-16 ENCOUNTER — Ambulatory Visit: Payer: 59

## 2020-11-28 ENCOUNTER — Encounter: Payer: Self-pay | Admitting: Internal Medicine

## 2020-12-07 ENCOUNTER — Other Ambulatory Visit: Payer: Self-pay

## 2020-12-07 ENCOUNTER — Ambulatory Visit
Admission: RE | Admit: 2020-12-07 | Discharge: 2020-12-07 | Disposition: A | Discharge status: Home / Self Care | Payer: 59 | Source: Ambulatory Visit | Attending: Obstetrics and Gynecology | Admitting: Obstetrics and Gynecology

## 2020-12-07 DIAGNOSIS — Z0000 Encounter for general adult medical examination without abnormal findings: Secondary | ICD-10-CM

## 2021-01-28 ENCOUNTER — Encounter: Payer: Self-pay | Admitting: Internal Medicine

## 2021-02-04 ENCOUNTER — Other Ambulatory Visit: Payer: Self-pay | Admitting: Internal Medicine

## 2021-02-08 ENCOUNTER — Telehealth: Payer: Self-pay | Admitting: Internal Medicine

## 2021-02-08 ENCOUNTER — Other Ambulatory Visit: Payer: Self-pay

## 2021-02-08 ENCOUNTER — Other Ambulatory Visit (INDEPENDENT_AMBULATORY_CARE_PROVIDER_SITE_OTHER): Payer: 59

## 2021-02-08 DIAGNOSIS — D649 Anemia, unspecified: Secondary | ICD-10-CM

## 2021-02-08 DIAGNOSIS — E039 Hypothyroidism, unspecified: Secondary | ICD-10-CM

## 2021-02-08 LAB — CBC WITH DIFFERENTIAL/PLATELET
Basophils Absolute: 0 10*3/uL (ref 0.0–0.1)
Basophils Relative: 0.8 % (ref 0.0–3.0)
Eosinophils Absolute: 0.1 10*3/uL (ref 0.0–0.7)
Eosinophils Relative: 2.1 % (ref 0.0–5.0)
HCT: 36.5 % (ref 36.0–46.0)
Hemoglobin: 12.4 g/dL (ref 12.0–15.0)
Lymphocytes Relative: 21.9 % (ref 12.0–46.0)
Lymphs Abs: 0.9 10*3/uL (ref 0.7–4.0)
MCHC: 34 g/dL (ref 30.0–36.0)
MCV: 83.8 fl (ref 78.0–100.0)
Monocytes Absolute: 0.3 10*3/uL (ref 0.1–1.0)
Monocytes Relative: 7.8 % (ref 3.0–12.0)
Neutro Abs: 2.9 10*3/uL (ref 1.4–7.7)
Neutrophils Relative %: 67.4 % (ref 43.0–77.0)
Platelets: 271 10*3/uL (ref 150.0–400.0)
RBC: 4.36 Mil/uL (ref 3.87–5.11)
RDW: 13.3 % (ref 11.5–15.5)
WBC: 4.3 10*3/uL (ref 4.0–10.5)

## 2021-02-08 LAB — TSH: TSH: 3.23 u[IU]/mL (ref 0.35–4.50)

## 2021-02-08 LAB — IRON: Iron: 106 ug/dL (ref 42–145)

## 2021-02-08 LAB — FERRITIN: Ferritin: 16.4 ng/mL (ref 10.0–291.0)

## 2021-02-08 NOTE — Telephone Encounter (Signed)
FYI. TSH will probably be off- pharmacy has been giving wrong dosage.

## 2021-02-08 NOTE — Telephone Encounter (Signed)
She was supposed to be taking Synthroid 50 mcg daily.  See earlier comments, will wait for the TSH. Patient stopped iron supplements due to constipation she requested labs, will check a CBC, iron, ferritin.

## 2021-02-08 NOTE — Telephone Encounter (Signed)
Patient came in for labs and wanted to pass a message on about new medication levothyroxine (SYNTHROID) 50 MCG tablet [[id number]]    First time picked up it was 25mg   Second time was 50mg (has 2 more pills out of 50mg ) she just pickedup 3rd bottle and that one is 25mg .   Doesn't know if the pharmacy got the mg wrong or if that's the way it is suppose to work.   ALSO  ferrous fumarate (HEMOCYTE - 106 MG FE) 325 (106 Fe) MG TABS tablet   She stopped taking. States that she could never use the bathroom. Was always stopped up. Is there anything we can switch her too?   Please advise  [phone]

## 2021-02-09 ENCOUNTER — Other Ambulatory Visit: Payer: Self-pay

## 2021-02-09 DIAGNOSIS — E039 Hypothyroidism, unspecified: Secondary | ICD-10-CM

## 2021-02-09 DIAGNOSIS — D649 Anemia, unspecified: Secondary | ICD-10-CM

## 2021-02-09 NOTE — Telephone Encounter (Addendum)
Advise patient: TSH is now within normal, recommend to take Synthroid 50 mcg, send a new prescription. Arrange a TSH in 3 months. Other labs showed  no iron deficiency and no anemia.

## 2021-02-09 NOTE — Telephone Encounter (Signed)
Patient is calling in reference to her lab results for TSH, patient is concerned b/c she only has one pill left for tomorrow of levothyroxine (SYNTHROID) 50 MCG tablet [[id number]]. Patient states she has one BOTTLE of levothyroxine (SYNTHROID) 25 MCG tablet [[id number]]  At home. Patient would like to know how to proceed. Which dose should she take.   Please Advise

## 2021-02-09 NOTE — Telephone Encounter (Signed)
Called pt and advised. Rx was sent on 02/05/2021. Future orders are in. -JMA

## 2021-02-09 NOTE — Telephone Encounter (Signed)
Please advise regarding labs.

## 2021-03-23 ENCOUNTER — Other Ambulatory Visit: Payer: Self-pay | Admitting: Family Medicine

## 2021-04-11 ENCOUNTER — Encounter: Payer: Self-pay | Admitting: Internal Medicine

## 2021-05-09 ENCOUNTER — Encounter: Payer: Self-pay | Admitting: Internal Medicine

## 2021-05-09 MED ORDER — BACLOFEN 10 MG PO TABS: 5.0000 mg | ORAL_TABLET | Freq: Three times a day (TID) | ORAL | 0 refills | Status: DC | PRN

## 2021-05-10 ENCOUNTER — Other Ambulatory Visit: Payer: 59

## 2021-06-19 ENCOUNTER — Encounter: Payer: Self-pay | Admitting: Internal Medicine

## 2021-07-10 ENCOUNTER — Encounter: Payer: Self-pay | Admitting: Internal Medicine

## 2021-07-10 ENCOUNTER — Ambulatory Visit (INDEPENDENT_AMBULATORY_CARE_PROVIDER_SITE_OTHER): Payer: 59 | Admitting: Internal Medicine

## 2021-07-10 ENCOUNTER — Other Ambulatory Visit: Payer: Self-pay

## 2021-07-10 VITALS — BP 118/74 | HR 62 | Temp 98.4°F | Resp 16 | Ht 63.0 in | Wt 117.4 lb

## 2021-07-10 DIAGNOSIS — Z0000 Encounter for general adult medical examination without abnormal findings: Secondary | ICD-10-CM | POA: Diagnosis not present

## 2021-07-10 DIAGNOSIS — Z23 Encounter for immunization: Secondary | ICD-10-CM | POA: Diagnosis not present

## 2021-07-10 DIAGNOSIS — E559 Vitamin D deficiency, unspecified: Secondary | ICD-10-CM | POA: Diagnosis not present

## 2021-07-10 DIAGNOSIS — Z1159 Encounter for screening for other viral diseases: Secondary | ICD-10-CM

## 2021-07-10 NOTE — Patient Instructions (Signed)
    GO TO THE FRONT DESK, PLEASE SCHEDULE YOUR APPOINTMENTS Come back for blood work, fasting at your earliest convenience  Come back for physical exam in 1 year

## 2021-07-10 NOTE — Progress Notes (Signed)
Subjective:    Patient ID: Felicia Rollins, female    DOB: November 09, 1981, 40 y.o.   MRN: [id number]  DOS:  07/10/2021 Type of visit - description: CPX  Since the last office visit she is doing well has no major concerns.  Review of Systems   A 14 point review of systems is negative    Past Medical History:  Diagnosis Date   H/O hypokalemia    History of gastroenteritis    Hyperlipidemia    Muscle spasm    Frequent, keeps Mobic and Flexeril on hand   Palpitations     Past Surgical History:  Procedure Laterality Date   BREAST BIOPSY Right 2009   benign   BREAST SURGERY     biopsy   DILATION AND CURETTAGE OF UTERUS     TONSILLECTOMY     Social History   Socioeconomic History   Marital status: Married    Spouse name: Not on file   Number of children: 2   Years of education: Not on file   Highest education level: Not on file  Occupational History   Occupation: Futures trader   Occupation: desk job, from home   Tobacco Use   Smoking status: Never   Smokeless tobacco: Never  Substance and Sexual Activity   Alcohol use: No    Alcohol/week: 0.0 standard drinks    Comment: rarely   Drug use: No   Sexual activity: Yes  Other Topics Concern   Not on file  Social History Narrative   Children: 2010 , 2013    Social Determinants of Health   Financial Resource Strain: Not on file  Food Insecurity: Not on file  Transportation Needs: Not on file  Physical Activity: Not on file  Stress: Not on file  Social Connections: Not on file  Intimate Partner Violence: Not on file    Allergies as of 07/10/2021       Reactions   Shellfish Allergy    Per allergy testing        Medication List        Accurate as of July 10, 2021 11:59 PM. If you have any questions, ask your nurse or doctor.          baclofen 10 MG tablet Commonly known as: LIORESAL Take 0.5-1 tablets (5-10 mg total) by mouth 3 (three) times daily as needed for muscle spasms.    cholecalciferol 25 MCG (1000 UNIT) tablet Commonly known as: VITAMIN D3 Take 1 tablet (1,000 Units total) by mouth in the morning and at bedtime.   levothyroxine 50 MCG tablet Commonly known as: SYNTHROID Take 1 tablet (50 mcg total) by mouth daily before breakfast.   multivitamin with minerals Tabs tablet Take 1 tablet by mouth daily.           Objective:   Physical Exam BP 118/74 (BP Location: Left Arm, Patient Position: Sitting, Cuff Size: Small)   Pulse 62   Temp 98.4 F (36.9 C) (Oral)   Resp 16   Ht 5\' 3"  (1.6 m)   Wt 117 lb 6 oz (53.2 kg)   LMP 06/18/2021 (Approximate)   SpO2 98%   BMI 20.79 kg/m  General: Well developed, NAD, BMI noted Neck: No  thyromegaly  HEENT:  Normocephalic . Face symmetric, atraumatic Lungs:  CTA B Normal respiratory effort, no intercostal retractions, no accessory muscle use. Heart: RRR,  no murmur.  Abdomen:  Not distended, soft, non-tender. No rebound or rigidity.   Lower extremities: no pretibial  edema bilaterally  Skin: Exposed areas without rash. Not pale. Not jaundice Neurologic:  alert & oriented X3.  Speech normal, gait appropriate for age and unassisted Strength symmetric and appropriate for age.  Psych: Cognition and judgment appear intact.  Cooperative with normal attention span and concentration.  Behavior appropriate. No anxious or depressed appearing.     Assessment      Assessment Mild hypothyroidism (Rx levothyroxine 05/2020) Palpitations neg Holter 08-2015 Vitamin D deficiency + FH CAD father early age + FH of breast cancer mother early age Not on BCP (condoms)   PLAN: Here for CPX Mild hypothyroidism: Good compliance with Synthroid, checking labs Vitamin D deficiency: On a multivitamin plus vitamin D3 2000 units daily.  Checking labs. Anemia, history of: No GI symptoms, she has very heavy periods monthly, sometimes they last up to 6 days.  Checking labs.  On iron (included on her  multivitamins). RTC 1 year   This visit occurred during the SARS-CoV-2 public health emergency.  Safety protocols were in place, including screening questions prior to the visit, additional usage of staff PPE, and extensive cleaning of exam room while observing appropriate contact time as indicated for disinfecting solutions.

## 2021-07-11 ENCOUNTER — Encounter: Payer: Self-pay | Admitting: Internal Medicine

## 2021-07-11 NOTE — Assessment & Plan Note (Signed)
Td 2012, booster today COVID vax x 3 Female care : PAP already schedule for later this year + FH breast cancer,  MMG 10-2020 ([facility]) Labs: Will RTC fasting for CMP, FLP, CBC, TSH, vitamin D, A1c, iron, ferritin, hep C Lifestyle: Eats healthy, stays active.

## 2021-07-11 NOTE — Assessment & Plan Note (Signed)
Here for CPX Mild hypothyroidism: Good compliance with Synthroid, checking labs Vitamin D deficiency: On a multivitamin plus vitamin D3 2000 units daily.  Checking labs. Anemia, history of: No GI symptoms, she has very heavy periods monthly, sometimes they last up to 6 days.  Checking labs.  On iron (included on her multivitamins). RTC 1 year

## 2021-07-12 ENCOUNTER — Other Ambulatory Visit: Payer: Self-pay

## 2021-07-12 ENCOUNTER — Other Ambulatory Visit (INDEPENDENT_AMBULATORY_CARE_PROVIDER_SITE_OTHER): Payer: 59

## 2021-07-12 DIAGNOSIS — E559 Vitamin D deficiency, unspecified: Secondary | ICD-10-CM | POA: Diagnosis not present

## 2021-07-12 DIAGNOSIS — Z1159 Encounter for screening for other viral diseases: Secondary | ICD-10-CM

## 2021-07-12 DIAGNOSIS — Z0000 Encounter for general adult medical examination without abnormal findings: Secondary | ICD-10-CM

## 2021-07-12 LAB — HEMOGLOBIN A1C: Hgb A1c MFr Bld: 5.3 % (ref 4.6–6.5)

## 2021-07-12 LAB — CBC WITH DIFFERENTIAL/PLATELET
Basophils Absolute: 0 10*3/uL (ref 0.0–0.1)
Basophils Relative: 0.6 % (ref 0.0–3.0)
Eosinophils Absolute: 0.1 10*3/uL (ref 0.0–0.7)
Eosinophils Relative: 2 % (ref 0.0–5.0)
HCT: 35.2 % — ABNORMAL LOW (ref 36.0–46.0)
Hemoglobin: 11.7 g/dL — ABNORMAL LOW (ref 12.0–15.0)
Lymphocytes Relative: 17.2 % (ref 12.0–46.0)
Lymphs Abs: 0.9 10*3/uL (ref 0.7–4.0)
MCHC: 33.4 g/dL (ref 30.0–36.0)
MCV: 85.6 fl (ref 78.0–100.0)
Monocytes Absolute: 0.4 10*3/uL (ref 0.1–1.0)
Monocytes Relative: 9 % (ref 3.0–12.0)
Neutro Abs: 3.5 10*3/uL (ref 1.4–7.7)
Neutrophils Relative %: 71.2 % (ref 43.0–77.0)
Platelets: 216 10*3/uL (ref 150.0–400.0)
RBC: 4.11 Mil/uL (ref 3.87–5.11)
RDW: 14.4 % (ref 11.5–15.5)
WBC: 5 10*3/uL (ref 4.0–10.5)

## 2021-07-12 LAB — COMPREHENSIVE METABOLIC PANEL
ALT: 12 U/L (ref 0–35)
AST: 16 U/L (ref 0–37)
Albumin: 4.6 g/dL (ref 3.5–5.2)
Alkaline Phosphatase: 41 U/L (ref 39–117)
BUN: 13 mg/dL (ref 6–23)
CO2: 26 mEq/L (ref 19–32)
Calcium: 9 mg/dL (ref 8.4–10.5)
Chloride: 106 mEq/L (ref 96–112)
Creatinine, Ser: 0.79 mg/dL (ref 0.40–1.20)
GFR: 92.52 mL/min (ref >60.00)
Glucose, Bld: 85 mg/dL (ref 70–99)
Potassium: 4 mEq/L (ref 3.5–5.1)
Sodium: 139 mEq/L (ref 135–145)
Total Bilirubin: 0.4 mg/dL (ref 0.2–1.2)
Total Protein: 7.1 g/dL (ref 6.0–8.3)

## 2021-07-12 LAB — VITAMIN D 25 HYDROXY (VIT D DEFICIENCY, FRACTURES): VITD: 27.07 ng/mL — ABNORMAL LOW (ref 30.00–100.00)

## 2021-07-12 LAB — LIPID PANEL
Cholesterol: 210 mg/dL — ABNORMAL HIGH (ref 0–200)
HDL: 33.2 mg/dL — ABNORMAL LOW (ref >39.00)
LDL Cholesterol: 142 mg/dL — ABNORMAL HIGH (ref 0–99)
NonHDL: 177.18
Total CHOL/HDL Ratio: 6
Triglycerides: 176 mg/dL — ABNORMAL HIGH (ref 0.0–149.0)
VLDL: 35.2 mg/dL (ref 0.0–40.0)

## 2021-07-12 LAB — IRON: Iron: 61 ug/dL (ref 42–145)

## 2021-07-12 LAB — TSH: TSH: 2.9 u[IU]/mL (ref 0.35–5.50)

## 2021-07-12 LAB — FERRITIN: Ferritin: 19 ng/mL (ref 10.0–291.0)

## 2021-07-13 LAB — HEPATITIS C ANTIBODY
Hepatitis C Ab: NONREACTIVE
SIGNAL TO CUT-OFF: 0.02 (ref <1.00)

## 2021-07-13 MED ORDER — VITAMIN D (ERGOCALCIFEROL) 1.25 MG (50000 UNIT) PO CAPS: 50000.0000 [IU] | ORAL_CAPSULE | ORAL | 0 refills | Status: DC

## 2021-07-13 NOTE — Addendum Note (Signed)
Addended byConrad [place] D on: 07/13/2021 01:07 PM   Modules accepted: Orders

## 2021-08-23 LAB — HM PAP SMEAR: HM Pap smear: NEGATIVE

## 2021-08-28 ENCOUNTER — Encounter: Payer: Self-pay | Admitting: Internal Medicine

## 2021-10-04 ENCOUNTER — Encounter: Payer: 59 | Admitting: Internal Medicine

## 2021-10-05 ENCOUNTER — Telehealth: Payer: Self-pay | Admitting: Internal Medicine

## 2021-10-05 NOTE — Telephone Encounter (Signed)
Spoke w/ Pt- she has taken her Baclofen this morning already wanted to know if okay to take ibuprofen, I informed okay to take alternate w/ Tylenol and take w/ food, informed to use heating pad if needed. To come in if not improving/worsening. Do you agree w/ recommendations?

## 2021-10-05 NOTE — Telephone Encounter (Signed)
Agree, thank you

## 2021-10-05 NOTE — Telephone Encounter (Signed)
Patient wants advice on how to treat her back pain, she states she was doing laundry and when she reached for something she believes she pulled something. She states she feels like she is having back spasms and would like to know how to treat them Please advice.

## 2021-10-10 ENCOUNTER — Other Ambulatory Visit: Payer: Self-pay

## 2021-10-10 ENCOUNTER — Ambulatory Visit (INDEPENDENT_AMBULATORY_CARE_PROVIDER_SITE_OTHER): Payer: 59

## 2021-10-10 DIAGNOSIS — Z23 Encounter for immunization: Secondary | ICD-10-CM | POA: Diagnosis not present

## 2021-10-18 ENCOUNTER — Other Ambulatory Visit: Payer: Self-pay | Admitting: Internal Medicine

## 2021-10-27 ENCOUNTER — Other Ambulatory Visit: Payer: Self-pay | Admitting: Internal Medicine

## 2021-11-09 ENCOUNTER — Other Ambulatory Visit: Payer: Self-pay | Admitting: Obstetrics and Gynecology

## 2021-11-09 DIAGNOSIS — Z1231 Encounter for screening mammogram for malignant neoplasm of breast: Secondary | ICD-10-CM

## 2021-12-19 ENCOUNTER — Ambulatory Visit
Admission: RE | Admit: 2021-12-19 | Discharge: 2021-12-19 | Disposition: A | Discharge status: Home / Self Care | Payer: 59 | Source: Ambulatory Visit | Attending: Obstetrics and Gynecology | Admitting: Obstetrics and Gynecology

## 2021-12-19 DIAGNOSIS — Z1231 Encounter for screening mammogram for malignant neoplasm of breast: Secondary | ICD-10-CM

## 2022-07-12 ENCOUNTER — Encounter: Payer: Self-pay | Admitting: Internal Medicine

## 2022-07-12 ENCOUNTER — Ambulatory Visit (INDEPENDENT_AMBULATORY_CARE_PROVIDER_SITE_OTHER): Payer: 59 | Admitting: Internal Medicine

## 2022-07-12 VITALS — BP 118/70 | HR 81 | Temp 98.4°F | Resp 16 | Ht 63.0 in | Wt 118.5 lb

## 2022-07-12 DIAGNOSIS — E559 Vitamin D deficiency, unspecified: Secondary | ICD-10-CM

## 2022-07-12 DIAGNOSIS — E039 Hypothyroidism, unspecified: Secondary | ICD-10-CM

## 2022-07-12 DIAGNOSIS — Z0000 Encounter for general adult medical examination without abnormal findings: Secondary | ICD-10-CM | POA: Diagnosis not present

## 2022-07-12 DIAGNOSIS — Z8249 Family history of ischemic heart disease and other diseases of the circulatory system: Secondary | ICD-10-CM

## 2022-07-12 LAB — CBC WITH DIFFERENTIAL/PLATELET
Basophils Absolute: 0 10*3/uL (ref 0.0–0.1)
Basophils Relative: 0.5 % (ref 0.0–3.0)
Eosinophils Absolute: 0.1 10*3/uL (ref 0.0–0.7)
Eosinophils Relative: 1.2 % (ref 0.0–5.0)
HCT: 37.6 % (ref 36.0–46.0)
Hemoglobin: 12.5 g/dL (ref 12.0–15.0)
Lymphocytes Relative: 16.5 % (ref 12.0–46.0)
Lymphs Abs: 0.8 10*3/uL (ref 0.7–4.0)
MCHC: 33.1 g/dL (ref 30.0–36.0)
MCV: 87.1 fl (ref 78.0–100.0)
Monocytes Absolute: 0.5 10*3/uL (ref 0.1–1.0)
Monocytes Relative: 9.2 % (ref 3.0–12.0)
Neutro Abs: 3.6 10*3/uL (ref 1.4–7.7)
Neutrophils Relative %: 72.6 % (ref 43.0–77.0)
Platelets: 216 10*3/uL (ref 150.0–400.0)
RBC: 4.32 Mil/uL (ref 3.87–5.11)
RDW: 13.5 % (ref 11.5–15.5)
WBC: 5 10*3/uL (ref 4.0–10.5)

## 2022-07-12 LAB — LIPID PANEL
Cholesterol: 214 mg/dL — ABNORMAL HIGH (ref 0–200)
HDL: 38.4 mg/dL — ABNORMAL LOW (ref >39.00)
LDL Cholesterol: 148 mg/dL — ABNORMAL HIGH (ref 0–99)
NonHDL: 175.5
Total CHOL/HDL Ratio: 6
Triglycerides: 138 mg/dL (ref 0.0–149.0)
VLDL: 27.6 mg/dL (ref 0.0–40.0)

## 2022-07-12 LAB — TSH: TSH: 2.18 u[IU]/mL (ref 0.35–5.50)

## 2022-07-12 LAB — COMPREHENSIVE METABOLIC PANEL
ALT: 7 U/L (ref 0–35)
AST: 13 U/L (ref 0–37)
Albumin: 4.7 g/dL (ref 3.5–5.2)
Alkaline Phosphatase: 38 U/L — ABNORMAL LOW (ref 39–117)
BUN: 11 mg/dL (ref 6–23)
CO2: 27 mEq/L (ref 19–32)
Calcium: 9.3 mg/dL (ref 8.4–10.5)
Chloride: 103 mEq/L (ref 96–112)
Creatinine, Ser: 0.79 mg/dL (ref 0.40–1.20)
GFR: 91.87 mL/min (ref >60.00)
Glucose, Bld: 81 mg/dL (ref 70–99)
Potassium: 3.9 mEq/L (ref 3.5–5.1)
Sodium: 138 mEq/L (ref 135–145)
Total Bilirubin: 0.4 mg/dL (ref 0.2–1.2)
Total Protein: 7.2 g/dL (ref 6.0–8.3)

## 2022-07-12 LAB — VITAMIN D 25 HYDROXY (VIT D DEFICIENCY, FRACTURES): VITD: 28.07 ng/mL — ABNORMAL LOW (ref 30.00–100.00)

## 2022-07-12 NOTE — Patient Instructions (Addendum)
Recommend multivitamin with iron Vitamin D3: 2000 units daily  Flu shot this fall     GO TO THE LAB : Get the blood work     GO TO THE FRONT DESK, PLEASE SCHEDULE YOUR APPOINTMENTS Come back for a physical exam in 1 year

## 2022-07-12 NOTE — Progress Notes (Unsigned)
   Subjective:    Patient ID: Felicia Rollins, female    DOB: 28-May-1981, 41 y.o.   MRN: [id number]  DOS:  07/12/2022 Type of visit - description: CPX  Here for CPX Since the last office visit is feeling well. I noted mild anemia, she reports heavy.  Associated with some stomach cramping.  Sees gynecology. No GI symptoms  Review of Systems See above   Past Medical History:  Diagnosis Date   H/O hypokalemia    History of gastroenteritis    Hyperlipidemia    Muscle spasm    Frequent, keeps Mobic and Flexeril on hand   Palpitations     Past Surgical History:  Procedure Laterality Date   BREAST BIOPSY Right 2009   benign   BREAST SURGERY     biopsy   DILATION AND CURETTAGE OF UTERUS     TONSILLECTOMY      Current Outpatient Medications  Medication Instructions   baclofen (LIORESAL) 5-10 mg, Oral, 3 times daily PRN   cholecalciferol (VITAMIN D3) 1,000 Units, 2 times daily   levothyroxine (SYNTHROID) 50 MCG tablet TAKE 1 TABLET BY MOUTH DAILY BEFORE BREAKFAST   Multiple Vitamin (MULTIVITAMIN WITH MINERALS) TABS tablet 1 tablet, Daily       Objective:   Physical Exam BP 118/70   Pulse 81   Temp 98.4 F (36.9 C) (Oral)   Resp 16   Ht 5\' 3"  (1.6 m)   Wt 118 lb 8 oz (53.8 kg)   LMP 06/29/2022 (Approximate)   SpO2 98%   BMI 20.99 kg/m  General: Well developed, NAD, BMI noted Neck: No  thyromegaly  HEENT:  Normocephalic . Face symmetric, atraumatic Lungs:  CTA B Normal respiratory effort, no intercostal retractions, no accessory muscle use. Heart: RRR,  no murmur.  Abdomen:  Not distended, soft, non-tender. No rebound or rigidity.   Lower extremities: no pretibial edema bilaterally  Skin: Exposed areas without rash. Not pale. Not jaundice Neurologic:  alert & oriented X3.  Speech normal, gait appropriate for age and unassisted Strength symmetric and appropriate for age.  Psych: Cognition and judgment appear intact.  Cooperative with normal attention span  and concentration.  Behavior appropriate. No anxious or depressed appearing.     Assessment     Assessment Mild hypothyroidism (Rx levothyroxine 05/2020) Palpitations neg Holter 08-2015 Vitamin D deficiency + FH CAD father early age + FH of breast cancer mother early age Not on BCP (condoms)   PLAN: Here for CPX Hypothyroidism: On Synthroid, check TSH Vitamin D deficiency: Has not taken supplements regularly.  Recommend 2000 units daily, checking levels. FH CAD: Father had his first heart attack in his 51s, benefits of coronary calcium score discussed, will proceed (recommend UPT prior to testing). Muscle spasm: Very rarely takes baclofen. Mild anemia: Likely related to heavy menses, recommend a multivitamin with iron daily. RTC 1 year  Td 2022 COVID vax x 3, bivalent done 10-2021, utd Flu shot q year  Female care : per gyn + FH breast cancer,  MMG 11-2021 ([facility]) Labs: CMP, FLP, CBC, TSH, vitamin D Lifestyle: Eats healthy, needs to exercise more .

## 2022-07-13 ENCOUNTER — Encounter: Payer: Self-pay | Admitting: Internal Medicine

## 2022-07-13 NOTE — Assessment & Plan Note (Signed)
Here for CPX Hypothyroidism: On Synthroid, check TSH Vitamin D deficiency: Has not taken supplements regularly.  Recommend 2000 units daily, checking levels. FH CAD: Father had his first heart attack in his 47s, benefits of coronary calcium score discussed, will proceed (recommend UPT prior to testing). Muscle spasms: Very rarely takes baclofen. Mild anemia: Likely related to heavy menses, recommend a multivitamin with iron daily. RTC 1 year

## 2022-07-13 NOTE — Assessment & Plan Note (Signed)
Td 2022 COVID vax x 3, bivalent done 10-2021, thus utd Flu shot q year  Female care : per gyn + FH breast cancer,  MMG 11-2021 ([facility]) Labs: CMP, FLP, CBC, TSH, vitamin D Lifestyle: Eats healthy, needs to exercise more .

## 2022-07-15 MED ORDER — VITAMIN D (ERGOCALCIFEROL) 1.25 MG (50000 UNIT) PO CAPS: 50000.0000 [IU] | ORAL_CAPSULE | ORAL | 0 refills | Status: DC

## 2022-07-15 NOTE — Addendum Note (Signed)
Addended byConrad Newberry D on: 07/15/2022 08:16 AM   Modules accepted: Orders

## 2022-07-25 ENCOUNTER — Telehealth: Payer: Self-pay | Admitting: Internal Medicine

## 2022-07-26 NOTE — Telephone Encounter (Signed)
Pt called to follow up on levothyroxine refill request that was sent to [place] from the pharmacy. Advised pt that we had received the request and Dr. Drue Novel would be looking into refilling it as soon as he sees it. Pt acknowledged understanding.

## 2022-07-26 NOTE — Telephone Encounter (Signed)
Rx sent 

## 2022-08-04 ENCOUNTER — Encounter (HOSPITAL_COMMUNITY): Payer: Self-pay

## 2022-08-04 ENCOUNTER — Telehealth (HOSPITAL_COMMUNITY): Payer: Self-pay | Admitting: Radiology

## 2022-08-04 NOTE — Telephone Encounter (Signed)
Called pt, left a detailed voicemail on her cell phone that her calcium scoring appt scheduled for 08/05/22 has been cancelled due to a software issue. I also sent the patient a message via MyChart. Informed pt that scheduling will contact her to reschedule. JM

## 2022-08-05 ENCOUNTER — Other Ambulatory Visit (HOSPITAL_BASED_OUTPATIENT_CLINIC_OR_DEPARTMENT_OTHER): Payer: 59

## 2022-08-15 ENCOUNTER — Other Ambulatory Visit (HOSPITAL_BASED_OUTPATIENT_CLINIC_OR_DEPARTMENT_OTHER): Payer: 59

## 2022-09-03 ENCOUNTER — Ambulatory Visit (HOSPITAL_BASED_OUTPATIENT_CLINIC_OR_DEPARTMENT_OTHER)
Admission: RE | Admit: 2022-09-03 | Discharge: 2022-09-03 | Disposition: A | Discharge status: Home / Self Care | Payer: 59 | Source: Ambulatory Visit | Attending: Internal Medicine | Admitting: Internal Medicine

## 2022-09-03 DIAGNOSIS — Z8249 Family history of ischemic heart disease and other diseases of the circulatory system: Secondary | ICD-10-CM | POA: Insufficient documentation

## 2022-09-13 IMAGING — MG MM DIGITAL SCREENING BILAT W/ TOMO AND CAD
8 series · 8 of 24 positions shown · non-contrast
Comparison: Previous exam(s).

CLINICAL DATA: Screening.

EXAM:
DIGITAL SCREENING BILATERAL MAMMOGRAM WITH TOMOSYNTHESIS AND CAD
TECHNIQUE: Bilateral screening digital craniocaudal and mediolateral oblique
mammograms were obtained. Bilateral screening digital breast
tomosynthesis was performed. The images were evaluated with
computer-aided detection.

[R MLO synth-2D]
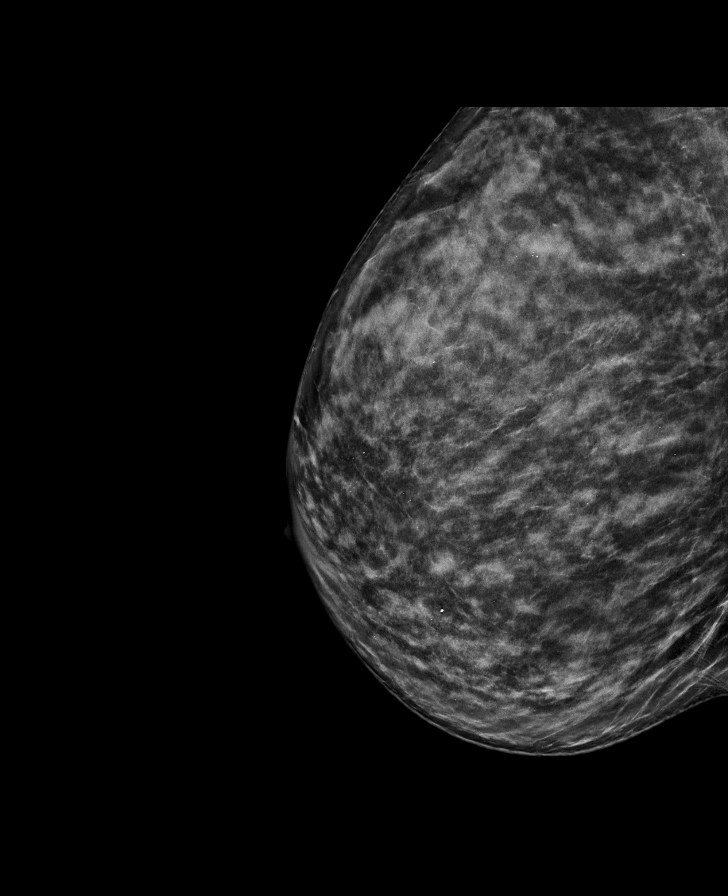

[L CC synth-2D]
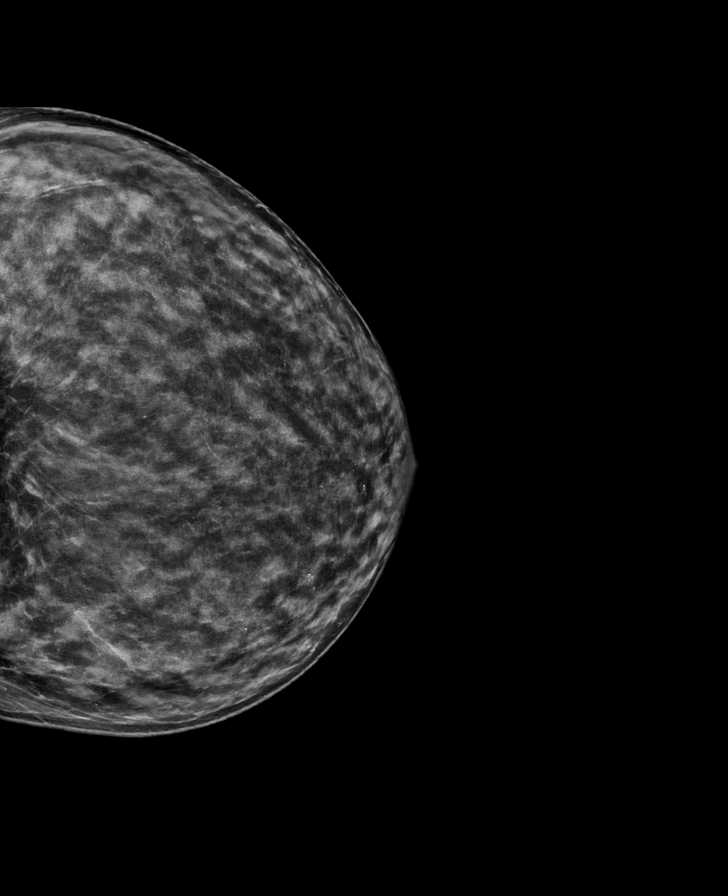

[L MLO synth-2D]
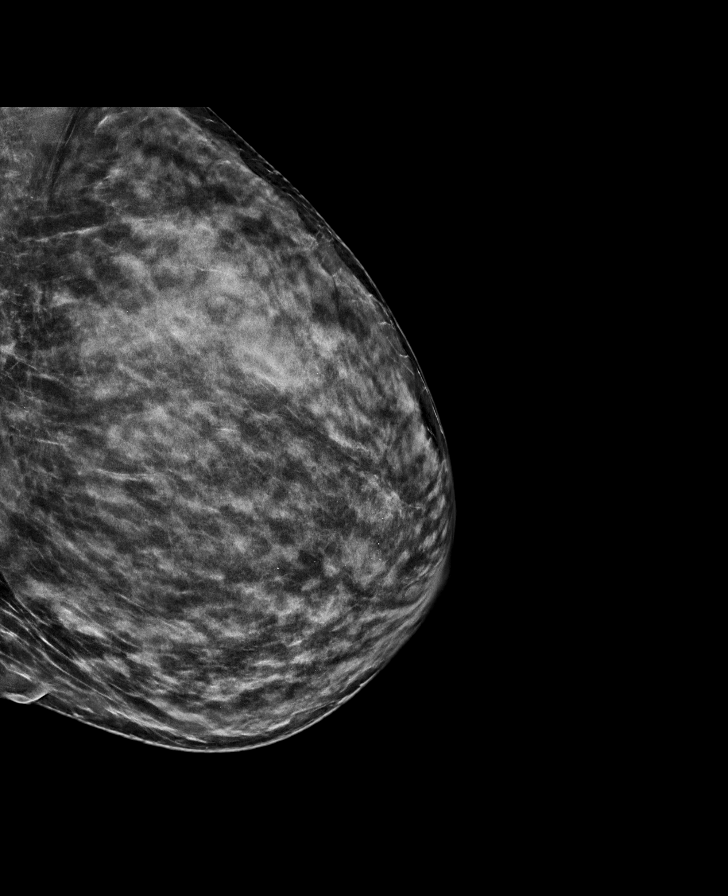

[R CC synth-2D]
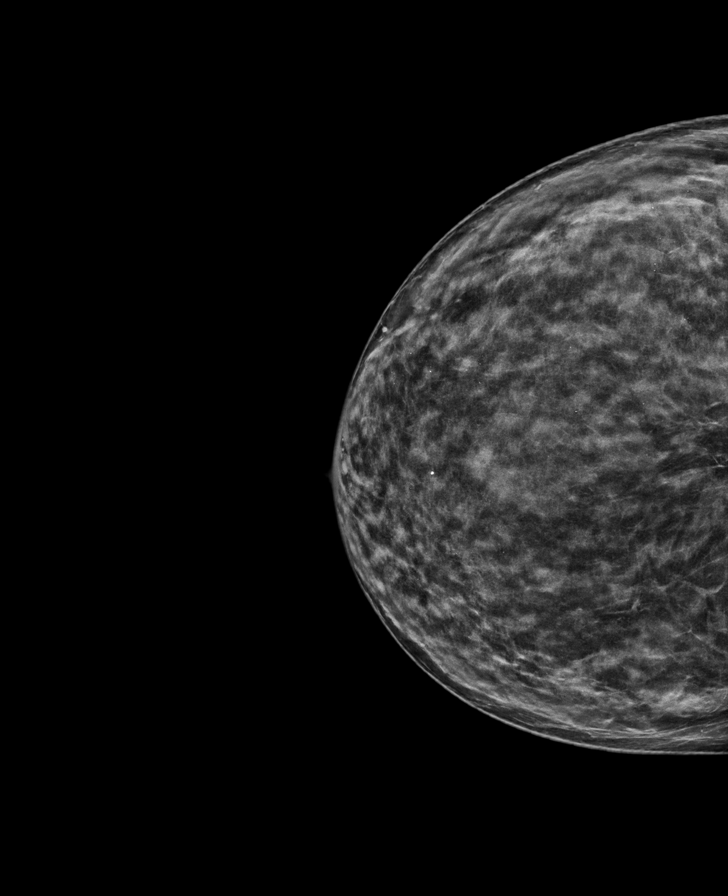

[R MLO tomo · tomo slice 39/78.0]
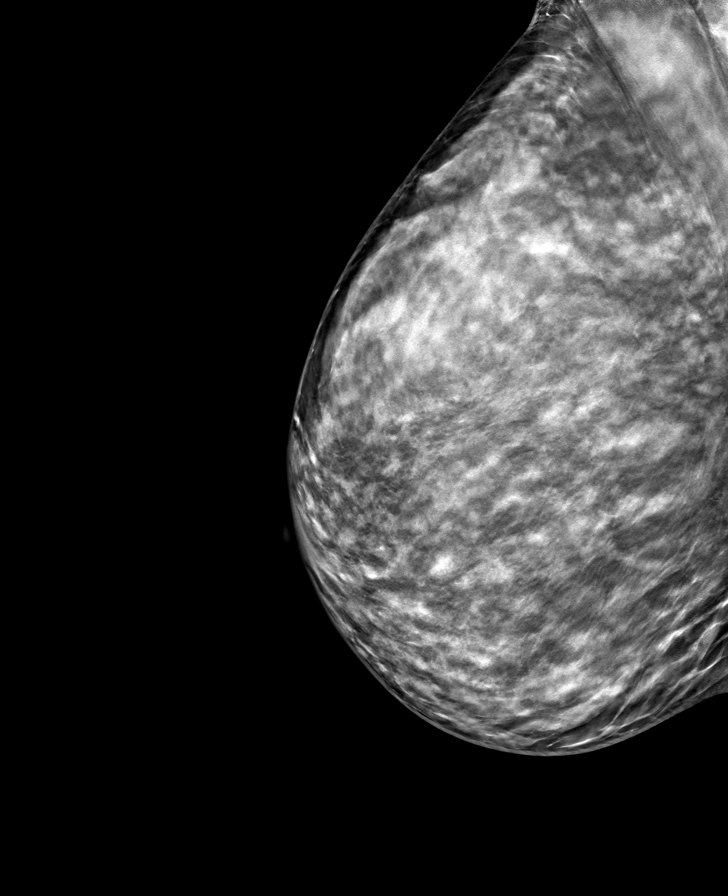

[R CC tomo · tomo slice 43/85.0]
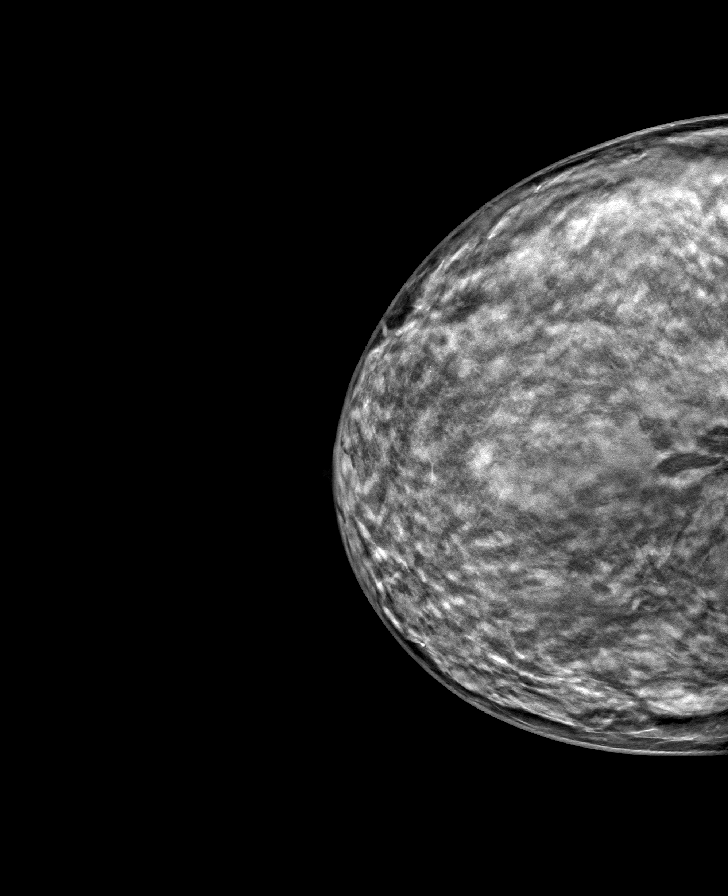

[L CC tomo · tomo slice 40/79.0]
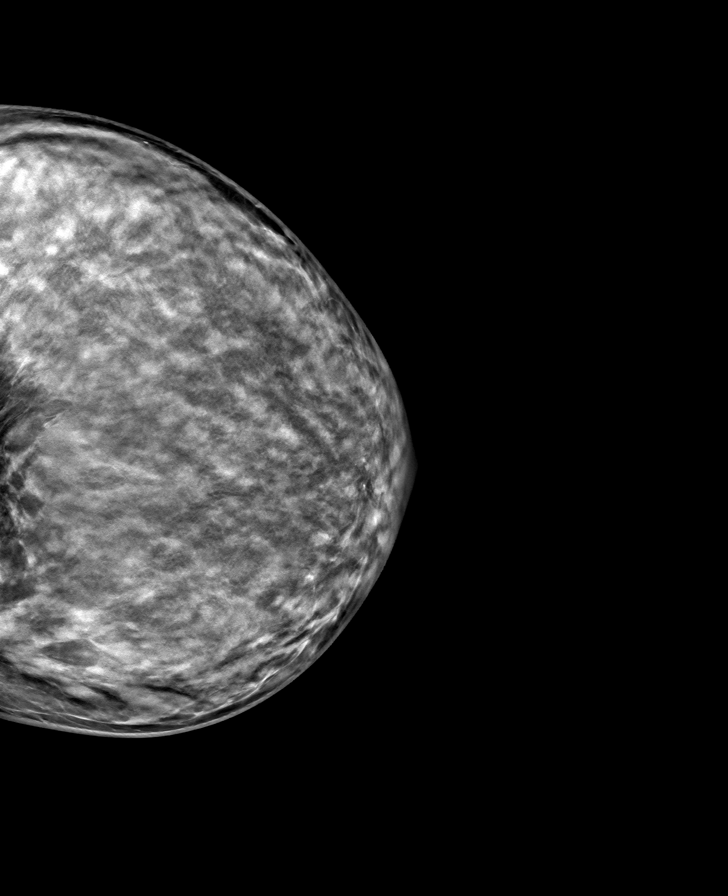

[L MLO tomo · tomo slice 41/82.0]
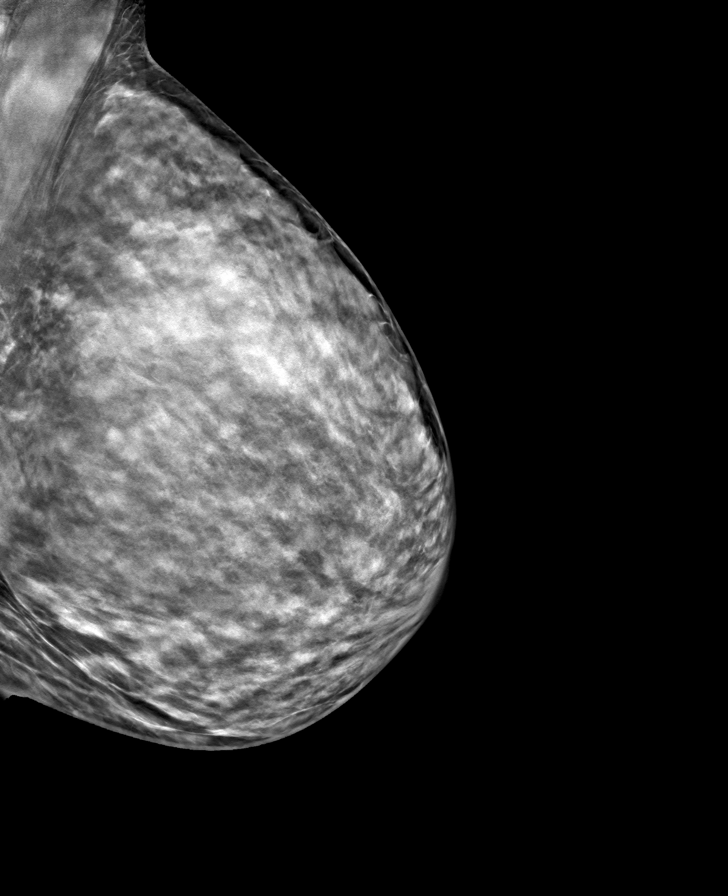

[8 of 24 positions shown; findings below may reference images not displayed]

ACR Breast Density Category d: The breast tissue is extremely dense,
which lowers the sensitivity of mammography
FINDINGS: There are no findings suspicious for malignancy.
IMPRESSION: No mammographic evidence of malignancy. A result letter of this
screening mammogram will be mailed directly to the patient.

RECOMMENDATION:
Screening mammogram in one year. (Code:TA-V-WV9)

BI-RADS CATEGORY  1: Negative.

## 2022-10-07 ENCOUNTER — Encounter: Payer: Self-pay | Admitting: Internal Medicine

## 2022-10-07 ENCOUNTER — Ambulatory Visit (INDEPENDENT_AMBULATORY_CARE_PROVIDER_SITE_OTHER): Payer: 59

## 2022-10-07 DIAGNOSIS — Z23 Encounter for immunization: Secondary | ICD-10-CM

## 2022-10-07 NOTE — Progress Notes (Signed)
Pt came in and for the flu shot today. Injection received in RD and tolerated well.

## 2022-10-09 ENCOUNTER — Other Ambulatory Visit: Payer: Self-pay | Admitting: Internal Medicine

## 2022-10-15 ENCOUNTER — Other Ambulatory Visit: Payer: Self-pay | Admitting: Internal Medicine

## 2022-11-26 ENCOUNTER — Other Ambulatory Visit: Payer: Self-pay | Admitting: Internal Medicine

## 2022-11-26 DIAGNOSIS — Z1231 Encounter for screening mammogram for malignant neoplasm of breast: Secondary | ICD-10-CM

## 2022-12-31 ENCOUNTER — Ambulatory Visit (HOSPITAL_BASED_OUTPATIENT_CLINIC_OR_DEPARTMENT_OTHER)
Admission: RE | Admit: 2022-12-31 | Discharge: 2022-12-31 | Disposition: A | Discharge status: Home / Self Care | Payer: 59 | Source: Ambulatory Visit | Attending: Family Medicine | Admitting: Family Medicine

## 2022-12-31 ENCOUNTER — Ambulatory Visit (INDEPENDENT_AMBULATORY_CARE_PROVIDER_SITE_OTHER): Payer: 59 | Admitting: Family Medicine

## 2022-12-31 ENCOUNTER — Other Ambulatory Visit: Payer: Self-pay | Admitting: Family Medicine

## 2022-12-31 ENCOUNTER — Encounter: Payer: Self-pay | Admitting: Family Medicine

## 2022-12-31 VITALS — BP 99/69 | HR 82 | Temp 98.1°F | Resp 16 | Ht 63.0 in | Wt 121.0 lb

## 2022-12-31 DIAGNOSIS — R1909 Other intra-abdominal and pelvic swelling, mass and lump: Secondary | ICD-10-CM | POA: Insufficient documentation

## 2022-12-31 LAB — CBC
HCT: 35.3 % — ABNORMAL LOW (ref 36.0–46.0)
Hemoglobin: 12 g/dL (ref 12.0–15.0)
MCHC: 34.1 g/dL (ref 30.0–36.0)
MCV: 86.9 fl (ref 78.0–100.0)
Platelets: 226 10*3/uL (ref 150.0–400.0)
RBC: 4.06 Mil/uL (ref 3.87–5.11)
RDW: 13.3 % (ref 11.5–15.5)
WBC: 5.2 10*3/uL (ref 4.0–10.5)

## 2022-12-31 NOTE — Progress Notes (Signed)
   Acute Office Visit  Subjective:     Patient ID: Felicia Rollins, female    DOB: [birth year], 42 y.o.   MRN: [id number]  Chief Complaint  Patient presents with   lump in groin area    HPI Patient is in today for a lump in her right groin.   Patient reports that 3-4 days ago she felt a bulge in her right groin that is firm and mildly tender to touch. She denies any recent nausea, vomiting, diarrhea, constipation, diarrhea, fevers, rashes, numbness, tingling, bruising, weight loss, body aches, severe pain. No other associated or radiating symptoms.    ROS All review of systems negative except what is listed in the HPI      Objective:    BP 99/69   Pulse 82   Temp 98.1 F (36.7 C)   Resp 16   Ht 5\' 3"  (1.6 m)   Wt 121 lb (54.9 kg)   SpO2 100%   BMI 21.43 kg/m    Physical Exam Vitals reviewed.  Constitutional:      Appearance: Normal appearance.  Abdominal:     General: Abdomen is flat.     Tenderness: There is no abdominal tenderness. There is no guarding.     Comments: Right groin nodule ~1-1.5 cm, not fixed, slightly mobile, no skin changes, induration, or fluctuance  Skin:    General: Skin is warm and dry.     Findings: No erythema, lesion or rash.  Neurological:     General: No focal deficit present.     Mental Status: She is alert and oriented to person, place, and time. Mental status is at baseline.  Psychiatric:        Mood and Affect: Mood normal.        Behavior: Behavior normal.        Thought Content: Thought content normal.        Judgment: Judgment normal.       No results found for any visits on 12/31/22.      Assessment & Plan:   Problem List Items Addressed This Visit   None Visit Diagnoses     Right groin mass    -  Primary   Relevant Orders   CBC   US Pelvis Limited      Consider lymph node, hernia, cyst, etc. Presentation more consistent with lymph node. Offered watch and wait approach, but she would like to go ahead with  [place] for peace of mind. Patient aware of signs/symptoms requiring further/urgent evaluation.    Return if symptoms worsen or fail to improve.  Terrilyn Saver, NP

## 2023-01-21 ENCOUNTER — Ambulatory Visit
Admission: RE | Admit: 2023-01-21 | Discharge: 2023-01-21 | Disposition: A | Discharge status: Home / Self Care | Payer: 59 | Source: Ambulatory Visit | Attending: Internal Medicine | Admitting: Internal Medicine

## 2023-01-21 DIAGNOSIS — Z1231 Encounter for screening mammogram for malignant neoplasm of breast: Secondary | ICD-10-CM

## 2023-04-17 ENCOUNTER — Other Ambulatory Visit: Payer: Self-pay | Admitting: Family Medicine

## 2023-04-18 MED ORDER — BACLOFEN 10 MG PO TABS: 5.0000 mg | ORAL_TABLET | Freq: Three times a day (TID) | ORAL | 2 refills | Status: DC | PRN

## 2023-07-14 ENCOUNTER — Encounter: Payer: Self-pay | Admitting: Internal Medicine

## 2023-07-14 ENCOUNTER — Ambulatory Visit: Payer: 59 | Admitting: Internal Medicine

## 2023-07-14 ENCOUNTER — Other Ambulatory Visit: Payer: Self-pay | Admitting: Internal Medicine

## 2023-07-14 VITALS — BP 108/62 | HR 75 | Temp 98.0°F | Resp 16 | Ht 63.0 in | Wt 120.5 lb

## 2023-07-14 DIAGNOSIS — E039 Hypothyroidism, unspecified: Secondary | ICD-10-CM | POA: Diagnosis not present

## 2023-07-14 DIAGNOSIS — E559 Vitamin D deficiency, unspecified: Secondary | ICD-10-CM

## 2023-07-14 DIAGNOSIS — L989 Disorder of the skin and subcutaneous tissue, unspecified: Secondary | ICD-10-CM

## 2023-07-14 DIAGNOSIS — Z0000 Encounter for general adult medical examination without abnormal findings: Secondary | ICD-10-CM | POA: Diagnosis not present

## 2023-07-14 LAB — CBC WITH DIFFERENTIAL/PLATELET
Basophils Absolute: 0 10*3/uL (ref 0.0–0.1)
Basophils Relative: 0.5 % (ref 0.0–3.0)
Eosinophils Absolute: 0.1 10*3/uL (ref 0.0–0.7)
Eosinophils Relative: 1.3 % (ref 0.0–5.0)
HCT: 37.9 % (ref 36.0–46.0)
Hemoglobin: 12.7 g/dL (ref 12.0–15.0)
Lymphocytes Relative: 18.2 % (ref 12.0–46.0)
Lymphs Abs: 0.8 10*3/uL (ref 0.7–4.0)
MCHC: 33.4 g/dL (ref 30.0–36.0)
MCV: 87.6 fl (ref 78.0–100.0)
Monocytes Absolute: 0.3 10*3/uL (ref 0.1–1.0)
Monocytes Relative: 6.4 % (ref 3.0–12.0)
Neutro Abs: 3.3 10*3/uL (ref 1.4–7.7)
Neutrophils Relative %: 73.6 % (ref 43.0–77.0)
Platelets: 226 10*3/uL (ref 150.0–400.0)
RBC: 4.33 Mil/uL (ref 3.87–5.11)
RDW: 13.3 % (ref 11.5–15.5)
WBC: 4.5 10*3/uL (ref 4.0–10.5)

## 2023-07-14 LAB — LIPID PANEL
Cholesterol: 207 mg/dL — ABNORMAL HIGH (ref 0–200)
HDL: 35.8 mg/dL — ABNORMAL LOW (ref >39.00)
LDL Cholesterol: 141 mg/dL — ABNORMAL HIGH (ref 0–99)
NonHDL: 171.39
Total CHOL/HDL Ratio: 6
Triglycerides: 152 mg/dL — ABNORMAL HIGH (ref 0.0–149.0)
VLDL: 30.4 mg/dL (ref 0.0–40.0)

## 2023-07-14 LAB — COMPREHENSIVE METABOLIC PANEL
ALT: 9 U/L (ref 0–35)
AST: 13 U/L (ref 0–37)
Albumin: 4.6 g/dL (ref 3.5–5.2)
Alkaline Phosphatase: 39 U/L (ref 39–117)
BUN: 12 mg/dL (ref 6–23)
CO2: 27 mEq/L (ref 19–32)
Calcium: 9.5 mg/dL (ref 8.4–10.5)
Chloride: 104 mEq/L (ref 96–112)
Creatinine, Ser: 0.75 mg/dL (ref 0.40–1.20)
GFR: 97.1 mL/min (ref >60.00)
Glucose, Bld: 81 mg/dL (ref 70–99)
Potassium: 4.2 mEq/L (ref 3.5–5.1)
Sodium: 138 mEq/L (ref 135–145)
Total Bilirubin: 0.3 mg/dL (ref 0.2–1.2)
Total Protein: 7.1 g/dL (ref 6.0–8.3)

## 2023-07-14 LAB — VITAMIN D 25 HYDROXY (VIT D DEFICIENCY, FRACTURES): VITD: 29.83 ng/mL — ABNORMAL LOW (ref 30.00–100.00)

## 2023-07-14 LAB — TSH: TSH: 2.19 u[IU]/mL (ref 0.35–5.50)

## 2023-07-14 NOTE — Patient Instructions (Addendum)
 Vaccines I recommend: Covid booster Flu shot this fall      GO TO THE LAB : Get the blood work     GO TO THE FRONT DESK, PLEASE SCHEDULE YOUR APPOINTMENTS Come back for   physical exam in 1 year

## 2023-07-14 NOTE — Progress Notes (Unsigned)
Subjective:    Patient ID: Felicia Rollins, female    DOB: 29-Aug-1981, 42 y.o.   MRN: [id number]  DOS:  07/14/2023 Type of visit - description: cpx  Here for CPX Feeling well. Was seen at this office few months ago without R hernia lymphadenopathy.  On self-examination the area is back to normal.   Review of Systems See above   Past Medical History:  Diagnosis Date   H/O hypokalemia    History of gastroenteritis    Hyperlipidemia    Muscle spasm    Frequent, keeps Mobic and Flexeril on hand   Palpitations     Past Surgical History:  Procedure Laterality Date   BREAST BIOPSY Right 2009   benign   BREAST SURGERY     biopsy   DILATION AND CURETTAGE OF UTERUS     TONSILLECTOMY      Current Outpatient Medications  Medication Instructions   baclofen (LIORESAL) 5-10 mg, Oral, 3 times daily PRN   cholecalciferol (VITAMIN D3) 1,000 Units, 2 times daily   levothyroxine (SYNTHROID) 50 MCG tablet TAKE 1 TABLET BY MOUTH EVERY DAY BEFORE BREAKFAST   Multiple Vitamin (MULTIVITAMIN WITH MINERALS) TABS tablet 1 tablet, Daily   Vitamin D (Ergocalciferol) (DRISDOL) 50,000 Units, Oral, Every 7 days, For 12 weeks       Objective:   Physical Exam BP 108/62   Pulse 75   Temp 98 F (36.7 C) (Oral)   Resp 16   Ht 5\' 3"  (1.6 m)   Wt 120 lb 8 oz (54.7 kg)   LMP 07/10/2023 (Exact Date)   SpO2 99%   BMI 21.35 kg/m  General: Well developed, NAD, BMI noted Neck: No  thyromegaly  HEENT:  Normocephalic . Face symmetric, atraumatic Lymphatic system: No LAD's the neck, supraclavicular area or groin Lungs:  CTA B Normal respiratory effort, no intercostal retractions, no accessory muscle use. Heart: RRR,  no murmur.  Abdomen:  Not distended, soft, non-tender. No rebound or rigidity.   Lower extremities: no pretibial edema bilaterally  Skin: Exposed areas without rash. Not pale. Not jaundice Neurologic:  alert & oriented X3.  Speech normal, gait appropriate for age and  unassisted Strength symmetric and appropriate for age.  Psych: Cognition and judgment appear intact.  Cooperative with normal attention span and concentration.  Behavior appropriate. No anxious or depressed appearing.     Assessment     Assessment Mild hypothyroidism (Rx levothyroxine 05/2020) Palpitations neg Holter 08-2015 Vitamin D deficiency + FH CAD father early age + FH of breast cancer mother early age Not on BCP (condoms)   PLAN: Here for CPX  Td 2022 COVID vax rec  Flu shot q year  Female care : per gyn + FH breast cancer,  MMG 11-2023 ([facility]) Labs: CMP, FLP, CBC, TSH, vitamin D Lifestyle: Exercising less recently due to weather, plans to do more in the fall. Mild hypothyroidism: On Synthroid, checking labs Vitamin D deficiency: Not taking vitamins consistently, recommend to do so, checking levels. Lymphadenopathy, R groin lymphadenopathy, was evaluated few months ago, ultrasound report reviewed.  Currently self-exam is back to normal, clinical exam today negative.  Recommend observation Moles: Reports multiple moles, she is fair skinned, sunscreen use discussed, request Derm referral.  Sent. RTC 1 year   === Hypothyroidism: On Synthroid, check TSH Vitamin D deficiency: Has not taken supplements regularly.  Recommend 2000 units daily, checking levels. FH CAD: Father had his first heart attack in his 28s, benefits of coronary calcium score discussed,  will proceed (recommend UPT prior to testing). Muscle spasms: Very rarely takes baclofen. Mild anemia: Likely related to heavy menses, recommend a multivitamin with iron daily. RTC 1 year

## 2023-07-14 NOTE — Telephone Encounter (Signed)
Appt today

## 2023-07-15 ENCOUNTER — Encounter: Payer: Self-pay | Admitting: Internal Medicine

## 2023-07-15 NOTE — Assessment & Plan Note (Signed)
Here for CPX Mild hypothyroidism: On Synthroid, checking labs Vitamin D deficiency: Not taking vitamins consistently, recommend to do so, checking levels. Lymphadenopathy, was eval  few months ago for a  R groin lymphadenopathy, US done, report reviewed.  Currently self-exam is back to normal, clinical exam today negative.  Recommend observation Moles: Reports multiple moles, she is fair skinned, sunscreen use discussed, request Derm referral.  Sent. RTC 1 year

## 2023-07-15 NOTE — Assessment & Plan Note (Deleted)
Td 2022 COVID vax rec  Flu shot q year  Female care : per gyn + FH breast cancer,  MMG 11-2023 ([facility]) Labs: CMP, FLP, CBC, TSH, vitamin D Lifestyle: Exercising less recently due to weather, plans to do more in the fall.

## 2023-07-15 NOTE — Assessment & Plan Note (Signed)
Td 2022 COVID vax rec  Flu shot q year  Female care : per gyn + FH breast cancer,  MMG 11-2023 ([facility]) Labs: CMP, FLP, CBC, TSH, vitamin D Lifestyle: Exercising less recently due to weather, plans to do more in the fall.

## 2023-07-17 MED ORDER — VITAMIN D (ERGOCALCIFEROL) 1.25 MG (50000 UNIT) PO CAPS: 50000.0000 [IU] | ORAL_CAPSULE | ORAL | 0 refills | Status: AC

## 2023-07-17 NOTE — Addendum Note (Signed)
Addended byConrad Cerulean D on: 07/17/2023 08:27 AM   Modules accepted: Orders

## 2023-10-17 ENCOUNTER — Ambulatory Visit (INDEPENDENT_AMBULATORY_CARE_PROVIDER_SITE_OTHER): Payer: 59

## 2023-10-17 DIAGNOSIS — Z23 Encounter for immunization: Secondary | ICD-10-CM

## 2023-11-24 ENCOUNTER — Ambulatory Visit (INDEPENDENT_AMBULATORY_CARE_PROVIDER_SITE_OTHER): Payer: 59 | Admitting: Nurse Practitioner

## 2023-11-24 ENCOUNTER — Encounter: Payer: Self-pay | Admitting: Nurse Practitioner

## 2023-11-24 VITALS — BP 122/70 | HR 99 | Temp 97.9°F | Ht 63.0 in | Wt 119.4 lb

## 2023-11-24 DIAGNOSIS — J22 Unspecified acute lower respiratory infection: Secondary | ICD-10-CM

## 2023-11-24 MED ORDER — AZITHROMYCIN 250 MG PO TABS: ORAL_TABLET | ORAL | 0 refills | Status: AC

## 2023-11-24 MED ORDER — PROMETHAZINE-DM 6.25-15 MG/5ML PO SYRP: 5.0000 mL | ORAL_SOLUTION | Freq: Four times a day (QID) | ORAL | 0 refills | Status: DC | PRN

## 2023-11-24 NOTE — Progress Notes (Signed)
Acute Office Visit  Subjective:     Patient ID: Felicia Rollins, female    DOB: 07/12/1981, 42 y.o.   MRN: [id number]  Chief Complaint  Patient presents with   Cough    With chest congestion since Wednesday-started Mucinex on Saturday   HPI:   Patient is in today for cough for the last 6 days.  Discussed the use of AI scribe software for clinical note transcription with the patient, who gave verbal consent to proceed.  History of Present Illness   The patient, with a history of hypothyroidism, presents with chest congestion and a dry cough that started last Wednesday or Thursday. She initially thought it was allergies and took Xyzal, but when symptoms persisted, she started Mucinex on Saturday. Despite this, the cough remains dry and unproductive, with the exception of a small amount of yellow mucus coughed up in the mornings. She denies fever, stuffy nose, and runny nose, chest pain, and shortness of breath but reports a sore throat due to the coughing and chest tightness. She has not experienced any ear pain. She reports recent exposure to illness, as her daughter was treated with antibiotics for suspected walking pneumonia two weeks ago, and her son had surgery last week. She tested negative for COVID-19 at home on Friday.      ROS See pertinent positives and negatives per HPI.     Objective:    BP 122/70 (BP Location: Left Arm)   Pulse 99   Temp 97.9 F (36.6 C) (Oral)   Ht 5\' 3"  (1.6 m)   Wt 119 lb 6.4 oz (54.2 kg)   SpO2 99%   BMI 21.15 kg/m    Physical Exam Vitals and nursing note reviewed.  Constitutional:      General: She is not in acute distress.    Appearance: Normal appearance.  HENT:     Head: Normocephalic.     Right Ear: Tympanic membrane, ear canal and external ear normal.     Left Ear: Tympanic membrane, ear canal and external ear normal.     Nose:     Right Sinus: No maxillary sinus tenderness or frontal sinus tenderness.     Left Sinus: No  maxillary sinus tenderness or frontal sinus tenderness.     Mouth/Throat:     Mouth: Mucous membranes are moist.     Pharynx: Posterior oropharyngeal erythema present. No oropharyngeal exudate.  Eyes:     Conjunctiva/sclera: Conjunctivae normal.  Cardiovascular:     Rate and Rhythm: Normal rate and regular rhythm.     Pulses: Normal pulses.     Heart sounds: Normal heart sounds.  Pulmonary:     Effort: Pulmonary effort is normal.     Breath sounds: Normal breath sounds.  Musculoskeletal:     Cervical back: Normal range of motion and neck supple. No tenderness.  Skin:    General: Skin is warm.  Neurological:     General: No focal deficit present.     Mental Status: She is alert and oriented to person, place, and time.  Psychiatric:        Mood and Affect: Mood normal.        Behavior: Behavior normal.        Thought Content: Thought content normal.        Judgment: Judgment normal.       Assessment & Plan:   Assessment and Plan    Lower Respiratory Infection She exhibits persistent dry cough with minimal yellow sputum  production, indicating possible bronchitis or early infection, but reports no fever, rhinorrhea, sore throat, or ear pain, and no shortness of breath. We will start Azithromycin (Z-Pak) for potential infection and inflammation, continue Mucinex expectorant twice daily, and start promethazine DM for use at night as needed for sleep. Encouraging continued fluid intake, including chamomile tea with honey, is advised. Follow-up if symptoms worsen or any concerns.      Meds ordered this encounter  Medications   azithromycin (ZITHROMAX) 250 MG tablet    Sig: Take 2 tablets on day 1, then 1 tablet daily on days 2 through 5    Dispense:  6 tablet    Refill:  0   promethazine-dextromethorphan (PROMETHAZINE-DM) 6.25-15 MG/5ML syrup    Sig: Take 5 mLs by mouth 4 (four) times daily as needed for cough.    Dispense:  118 mL    Refill:  0    Return if symptoms worsen or  fail to improve.  Gerre Scull, NP

## 2023-11-24 NOTE — Patient Instructions (Addendum)
It was great to see you!  Start z pak 2 tablets today, then 1 tablet daily for 4 more days  Start cough syrup every 4 hours as needed.   Keep taking mucinex twice a day  Let's follow-up if symptoms worsen or any concerns  Take care,  Rodman Pickle, NP

## 2023-12-09 ENCOUNTER — Other Ambulatory Visit: Payer: Self-pay | Admitting: Obstetrics and Gynecology

## 2023-12-09 DIAGNOSIS — Z1231 Encounter for screening mammogram for malignant neoplasm of breast: Secondary | ICD-10-CM

## 2023-12-09 DIAGNOSIS — R92343 Mammographic extreme density, bilateral breasts: Secondary | ICD-10-CM

## 2024-01-23 ENCOUNTER — Ambulatory Visit
Admission: RE | Admit: 2024-01-23 | Discharge: 2024-01-23 | Disposition: A | Discharge status: Home / Self Care | Payer: 59 | Source: Ambulatory Visit | Attending: Obstetrics and Gynecology | Admitting: Obstetrics and Gynecology

## 2024-01-23 DIAGNOSIS — Z1231 Encounter for screening mammogram for malignant neoplasm of breast: Secondary | ICD-10-CM

## 2024-02-03 ENCOUNTER — Ambulatory Visit: Payer: 59 | Admitting: Internal Medicine

## 2024-02-03 ENCOUNTER — Encounter: Payer: Self-pay | Admitting: Internal Medicine

## 2024-02-11 ENCOUNTER — Telehealth: Payer: Self-pay | Admitting: Internal Medicine

## 2024-02-11 NOTE — Telephone Encounter (Signed)
Copied from CRM [id number]. Topic: General - Billing Inquiry >> Feb 11, 2024  9:40 AM Corin V wrote: Reason for CRM: Patient called stating she received a letter that she is being charged $50 for No Showing her appointment on 02/03/24. She cancelled it the morning of the appointment and wants to verify if she has a charge and why. Please call patient back with clarification.

## 2024-02-24 NOTE — Telephone Encounter (Signed)
 Spoke to pt to disregard letter as this was set in error and that she did not receive a NS charge.

## 2024-04-18 ENCOUNTER — Other Ambulatory Visit: Payer: Self-pay | Admitting: Internal Medicine

## 2024-05-04 ENCOUNTER — Encounter: Payer: Self-pay | Admitting: Physician Assistant

## 2024-05-04 ENCOUNTER — Ambulatory Visit (INDEPENDENT_AMBULATORY_CARE_PROVIDER_SITE_OTHER): Admitting: Physician Assistant

## 2024-05-04 VITALS — BP 116/76 | HR 90 | Temp 97.7°F | Ht 63.0 in | Wt 118.4 lb

## 2024-05-04 DIAGNOSIS — J0100 Acute maxillary sinusitis, unspecified: Secondary | ICD-10-CM

## 2024-05-04 MED ORDER — AMOXICILLIN 875 MG PO TABS: 875.0000 mg | ORAL_TABLET | Freq: Two times a day (BID) | ORAL | 0 refills | Status: AC

## 2024-05-04 NOTE — Progress Notes (Signed)
 Established patient visit   Patient: Felicia Rollins   DOB: 1981-12-09   43 y.o. Female  MRN: [id number] Visit Date: 05/04/2024  Today's healthcare provider: Trenton Frock, PA-C   Cc. Uri symptoms  Subjective     Pt reports cough, nasal congestion, facial pressure, x 6 days, worsening today. taking mucinex and allergy medicine otc with limited improvement.   Medications: Outpatient Medications Prior to Visit  Medication Sig   baclofen  (LIORESAL ) 10 MG tablet Take 0.5-1 tablets (5-10 mg total) by mouth 3 (three) times daily as needed for muscle spasms. (Patient not taking: Reported on 11/24/2023)   cholecalciferol (VITAMIN D3) 25 MCG (1000 UNIT) tablet Take 1,000 Units by mouth in the morning and at bedtime.   levothyroxine  (SYNTHROID ) 50 MCG tablet Take 1 tablet (50 mcg total) by mouth daily before breakfast. Okay to change if needed   Multiple Vitamin (MULTIVITAMIN WITH MINERALS) TABS tablet Take 1 tablet by mouth daily.   [DISCONTINUED] guaiFENesin (MUCINEX PO) Take by mouth.   [DISCONTINUED] promethazine -dextromethorphan (PROMETHAZINE -DM) 6.25-15 MG/5ML syrup Take 5 mLs by mouth 4 (four) times daily as needed for cough.   No facility-administered medications prior to visit.    Review of Systems  Constitutional:  Positive for fatigue. Negative for fever.  HENT:  Positive for congestion, postnasal drip, sinus pressure, sinus pain and sore throat.   Respiratory:  Positive for cough. Negative for shortness of breath and wheezing.   Cardiovascular:  Negative for chest pain and leg swelling.  Gastrointestinal:  Negative for abdominal pain.  Neurological:  Negative for dizziness and headaches.       Objective    BP 116/76   Pulse 90   Temp 97.7 F (36.5 C)   Ht 5\' 3"  (1.6 m)   Wt 118 lb 6.4 oz (53.7 kg)   SpO2 100%   BMI 20.97 kg/m    Physical Exam Constitutional:      General: She is awake.     Appearance: She is well-developed.  HENT:     Head:  Normocephalic.     Right Ear: Tympanic membrane normal.     Ears:     Comments: Left Tm bulging with clear fluid mildly erythematous    Mouth/Throat:     Pharynx: Posterior oropharyngeal erythema present.  Eyes:     Conjunctiva/sclera: Conjunctivae normal.  Cardiovascular:     Rate and Rhythm: Normal rate and regular rhythm.     Heart sounds: Normal heart sounds.  Pulmonary:     Effort: Pulmonary effort is normal.     Breath sounds: Normal breath sounds. No wheezing or rales.  Skin:    General: Skin is warm.  Neurological:     Mental Status: She is alert and oriented to person, place, and time.  Psychiatric:        Attention and Perception: Attention normal.        Mood and Affect: Mood normal.        Speech: Speech normal.        Behavior: Behavior is cooperative.      No results found for any visits on 05/04/24.  Assessment & Plan    Acute non-recurrent maxillary sinusitis -     Amoxicillin ; Take 1 tablet (875 mg total) by mouth 2 (two) times daily for 7 days.  Dispense: 14 tablet; Refill: 0  Reviewed viral vs bacterial infection Recommending waiting on abx the next few days, rest, hydration, cont otc meds. If symptoms persist the next  2-3 days recommend starting amoxicillin .  Return if symptoms worsen or fail to improve.       Trenton Frock, PA-C  [facility] [phone] (phone) [phone] (fax)  [facility]

## 2024-06-23 ENCOUNTER — Other Ambulatory Visit: Payer: Self-pay | Admitting: Internal Medicine

## 2024-06-23 MED ORDER — BACLOFEN 10 MG PO TABS: 5.0000 mg | ORAL_TABLET | Freq: Three times a day (TID) | ORAL | 2 refills | Status: AC | PRN

## 2024-06-23 NOTE — Telephone Encounter (Signed)
 Rx sent.

## 2024-06-23 NOTE — Telephone Encounter (Signed)
 Refill request for baclofen , last refilled 04/18/23 #40 and 0RF. Please advise?

## 2024-06-23 NOTE — Telephone Encounter (Signed)
 Copied from CRM [id number]. Topic: Clinical - Medication Refill >> Jun 23, 2024  2:27 PM Savanna F wrote: Medication: baclofen  (LIORESAL ) 10 MG tablet  Pt states she is on vacation in [place]  and having muscle spasms in upper neck and back and would like [facility] [place]  please  Has the patient contacted their pharmacy? 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 If patient does not wish to contact the pharmacy document the reason why and proceed with request.) (Agent: If yes, when and what did the pharmacy advise?)  This is the patient's preferred pharmacy:  [facility] [address], [place], [place] [address] Get directions [phone] Today's hours Store & Photo: Open, closes at 10:00 PM Pharmacy: Open, closes at 8:00 PM [facility]: Open, closes at 7:30 PM Pharmacy closes for lunch from 1:30 PM to 2:00 PM   Is this the correct pharmacy for this prescription? Yes If no, delete pharmacy and type the correct one.   Has the prescription been filled recently? No  Is the patient out of the medication? Yes  Has the patient been seen for an appointment in the last year OR does the patient have an upcoming appointment? Yes  Can we respond through MyChart? Yes  Agent: Please be advised that Rx refills may take up to 3 business days. We ask that you follow-up with your pharmacy.

## 2024-06-23 NOTE — Telephone Encounter (Signed)
 Yes, okay to refill, takes as needed for muscle spasms.

## 2024-06-30 ENCOUNTER — Ambulatory Visit
Admission: RE | Admit: 2024-06-30 | Discharge: 2024-06-30 | Disposition: A | Discharge status: Home / Self Care | Payer: 59 | Source: Ambulatory Visit | Attending: Obstetrics and Gynecology | Admitting: Obstetrics and Gynecology

## 2024-06-30 ENCOUNTER — Other Ambulatory Visit: Payer: Self-pay | Admitting: Obstetrics and Gynecology

## 2024-06-30 DIAGNOSIS — N63 Unspecified lump in unspecified breast: Secondary | ICD-10-CM

## 2024-06-30 DIAGNOSIS — R92343 Mammographic extreme density, bilateral breasts: Secondary | ICD-10-CM

## 2024-06-30 MED ORDER — GADOPICLENOL 0.5 MMOL/ML IV SOLN
5.0000 mL | Freq: Once | INTRAVENOUS | Status: AC | PRN
  Administered 2024-06-30: 5 mL via INTRAVENOUS

## 2024-07-07 ENCOUNTER — Inpatient Hospital Stay
Admission: RE | Admit: 2024-07-07 | Discharge: 2024-07-07 | Discharge status: Home / Self Care | Payer: Self-pay | Source: Ambulatory Visit | Attending: Obstetrics and Gynecology | Admitting: Obstetrics and Gynecology

## 2024-07-07 ENCOUNTER — Ambulatory Visit
Admission: RE | Admit: 2024-07-07 | Discharge: 2024-07-07 | Disposition: A | Discharge status: Home / Self Care | Payer: Self-pay | Source: Ambulatory Visit | Attending: Obstetrics and Gynecology | Admitting: Obstetrics and Gynecology

## 2024-07-07 ENCOUNTER — Other Ambulatory Visit: Payer: Self-pay | Admitting: Obstetrics and Gynecology

## 2024-07-07 ENCOUNTER — Ambulatory Visit
Admission: RE | Admit: 2024-07-07 | Discharge: 2024-07-07 | Disposition: A | Discharge status: Home / Self Care | Source: Ambulatory Visit | Attending: Obstetrics and Gynecology | Admitting: Obstetrics and Gynecology

## 2024-07-07 DIAGNOSIS — N63 Unspecified lump in unspecified breast: Secondary | ICD-10-CM

## 2024-07-09 ENCOUNTER — Other Ambulatory Visit: Payer: Self-pay | Admitting: Obstetrics and Gynecology

## 2024-07-09 DIAGNOSIS — R928 Other abnormal and inconclusive findings on diagnostic imaging of breast: Secondary | ICD-10-CM

## 2024-07-15 ENCOUNTER — Ambulatory Visit
Admission: RE | Admit: 2024-07-15 | Discharge: 2024-07-15 | Disposition: A | Discharge status: Home / Self Care | Source: Ambulatory Visit | Attending: Obstetrics and Gynecology | Admitting: Obstetrics and Gynecology

## 2024-07-15 ENCOUNTER — Inpatient Hospital Stay
Admission: RE | Admit: 2024-07-15 | Discharge: 2024-07-15 | Discharge status: Home / Self Care | Source: Ambulatory Visit | Attending: Obstetrics and Gynecology | Admitting: Obstetrics and Gynecology

## 2024-07-15 DIAGNOSIS — R928 Other abnormal and inconclusive findings on diagnostic imaging of breast: Secondary | ICD-10-CM

## 2024-07-15 MED ORDER — GADOPICLENOL 0.5 MMOL/ML IV SOLN
5.0000 mL | Freq: Once | INTRAVENOUS | Status: AC | PRN
  Administered 2024-07-15: 5 mL via INTRAVENOUS

## 2024-07-16 LAB — SURGICAL PATHOLOGY

## 2024-07-17 ENCOUNTER — Other Ambulatory Visit: Payer: Self-pay | Admitting: Internal Medicine

## 2024-07-19 ENCOUNTER — Encounter: Payer: Self-pay | Admitting: Internal Medicine

## 2024-07-19 ENCOUNTER — Ambulatory Visit: Payer: 59 | Admitting: Internal Medicine

## 2024-07-19 VITALS — BP 116/80 | HR 77 | Temp 98.0°F | Resp 16 | Ht 63.0 in | Wt 119.4 lb

## 2024-07-19 DIAGNOSIS — E039 Hypothyroidism, unspecified: Secondary | ICD-10-CM | POA: Diagnosis not present

## 2024-07-19 DIAGNOSIS — L989 Disorder of the skin and subcutaneous tissue, unspecified: Secondary | ICD-10-CM | POA: Diagnosis not present

## 2024-07-19 DIAGNOSIS — Z0000 Encounter for general adult medical examination without abnormal findings: Secondary | ICD-10-CM

## 2024-07-19 DIAGNOSIS — E559 Vitamin D deficiency, unspecified: Secondary | ICD-10-CM

## 2024-07-19 DIAGNOSIS — Z1211 Encounter for screening for malignant neoplasm of colon: Secondary | ICD-10-CM

## 2024-07-19 LAB — COMPREHENSIVE METABOLIC PANEL WITH GFR
ALT: 9 U/L (ref 0–35)
AST: 15 U/L (ref 0–37)
Albumin: 4.8 g/dL (ref 3.5–5.2)
Alkaline Phosphatase: 39 U/L (ref 39–117)
BUN: 11 mg/dL (ref 6–23)
CO2: 28 meq/L (ref 19–32)
Calcium: 9.8 mg/dL (ref 8.4–10.5)
Chloride: 103 meq/L (ref 96–112)
Creatinine, Ser: 0.81 mg/dL (ref 0.40–1.20)
GFR: 87.9 mL/min (ref >60.00)
Glucose, Bld: 86 mg/dL (ref 70–99)
Potassium: 4.3 meq/L (ref 3.5–5.1)
Sodium: 138 meq/L (ref 135–145)
Total Bilirubin: 0.4 mg/dL (ref 0.2–1.2)
Total Protein: 7.4 g/dL (ref 6.0–8.3)

## 2024-07-19 LAB — CBC WITH DIFFERENTIAL/PLATELET
Basophils Absolute: 0 K/uL (ref 0.0–0.1)
Basophils Relative: 0.5 % (ref 0.0–3.0)
Eosinophils Absolute: 0.1 K/uL (ref 0.0–0.7)
Eosinophils Relative: 1.9 % (ref 0.0–5.0)
HCT: 37.4 % (ref 36.0–46.0)
Hemoglobin: 12.7 g/dL (ref 12.0–15.0)
Lymphocytes Relative: 17.6 % (ref 12.0–46.0)
Lymphs Abs: 0.9 K/uL (ref 0.7–4.0)
MCHC: 33.9 g/dL (ref 30.0–36.0)
MCV: 86.7 fl (ref 78.0–100.0)
Monocytes Absolute: 0.3 K/uL (ref 0.1–1.0)
Monocytes Relative: 6.2 % (ref 3.0–12.0)
Neutro Abs: 3.6 K/uL (ref 1.4–7.7)
Neutrophils Relative %: 73.8 % (ref 43.0–77.0)
Platelets: 225 K/uL (ref 150.0–400.0)
RBC: 4.32 Mil/uL (ref 3.87–5.11)
RDW: 13.2 % (ref 11.5–15.5)
WBC: 4.9 K/uL (ref 4.0–10.5)

## 2024-07-19 LAB — LIPID PANEL
Cholesterol: 243 mg/dL — ABNORMAL HIGH (ref 0–200)
HDL: 42.1 mg/dL (ref >39.00)
LDL Cholesterol: 172 mg/dL — ABNORMAL HIGH (ref 0–99)
NonHDL: 200.46
Total CHOL/HDL Ratio: 6
Triglycerides: 141 mg/dL (ref 0.0–149.0)
VLDL: 28.2 mg/dL (ref 0.0–40.0)

## 2024-07-19 LAB — VITAMIN D 25 HYDROXY (VIT D DEFICIENCY, FRACTURES): VITD: 34.45 ng/mL (ref 30.00–100.00)

## 2024-07-19 LAB — TSH: TSH: 3.25 u[IU]/mL (ref 0.35–5.50)

## 2024-07-19 NOTE — Assessment & Plan Note (Signed)
 Here for CPX Td 2022 Vax I rec: COVID vax ; Flu shot q year  Female care : per gyn + FH breast cancer, had a mammogram, recent MRI guided biopsy negative, to have another MRI in 6 months.   CCS: 45 y/o cousin recently dx w/ colon cancer, 3 options discussed.  Elected colonoscopy Labs: CMP FLP CBC TSH vitamin D  Lifestyle: Diet is healthy, exercises on and off.

## 2024-07-19 NOTE — Assessment & Plan Note (Signed)
 Here for CPX   Other issues. Hypothyroidism: On Synthroid , check TSH. FH CAD: Asymptomatic Vitamin D  deficiency: Last year vitamin D  continue to be low, had ergocalciferol .  Continue OTCs, checking levels. Moles: Referral to dermatology failed last year, will try again RTC 1 year

## 2024-07-19 NOTE — Patient Instructions (Signed)
 We are referring you to gastroenterology for colonoscopy. You can reach them at [phone]  We are referring you to a new dermatologist    GO TO THE LAB :  Get the blood work   Your results will be posted on MyChart with my comments  Next office visit for a physical exam in 1 year Please make an appointment before you leave today

## 2024-07-19 NOTE — Progress Notes (Signed)
 Subjective:    Patient ID: Felicia Rollins Dia, female    DOB: 1981/10/14, 43 y.o.   MRN: [id number]  DOS:  07/19/2024 Type of visit - description: CPX.  Had a recent breast biopsy. Other than the soreness from the biopsy she is doing well.  Review of Systems  Other than above, a 14 point review of systems is negative     Past Medical History:  Diagnosis Date   H/O hypokalemia    History of gastroenteritis    Hyperlipidemia    Muscle spasm    Frequent, keeps Mobic  and Flexeril  on hand   Palpitations     Past Surgical History:  Procedure Laterality Date   BREAST BIOPSY Right 2009   benign   BREAST SURGERY     biopsy   DILATION AND CURETTAGE OF UTERUS     TONSILLECTOMY     Social History   Socioeconomic History   Marital status: Married    Spouse name: Not on file   Number of children: 2   Years of education: Not on file   Highest education level: Not on file  Occupational History   Occupation: Futures trader   Occupation: desk job, from home   Tobacco Use   Smoking status: Never   Smokeless tobacco: Never  Substance and Sexual Activity   Alcohol use: No    Alcohol/week: 0.0 standard drinks of alcohol    Comment: rarely   Drug use: No   Sexual activity: Yes  Other Topics Concern   Not on file  Social History Narrative   Children: 2010 , 2013    Social Drivers of Corporate investment banker Strain: Not on Ship broker Insecurity: Not on file  Transportation Needs: Not on file  Physical Activity: Not on file  Stress: Not on file  Social Connections: Not on file  Intimate Partner Violence: Not on file     Current Outpatient Medications  Medication Instructions   baclofen  (LIORESAL ) 5-10 mg, Oral, 3 times daily PRN   cholecalciferol (VITAMIN D3) 1,000 Units, 2 times daily   levothyroxine  (SYNTHROID ) 50 mcg, Oral, Daily before breakfast   Multiple Vitamin (MULTIVITAMIN WITH MINERALS) TABS tablet 1 tablet, Daily       Objective:   Physical  Exam BP 116/80   Pulse 77   Temp 98 F (36.7 C) (Oral)   Resp 16   Ht 5' 3 (1.6 m)   Wt 119 lb 6 oz (54.1 kg)   LMP 07/15/2024 (Exact Date)   SpO2 97%   BMI 21.15 kg/m  General: Well developed, NAD, BMI noted Neck: No  thyromegaly  HEENT:  Normocephalic . Face symmetric, atraumatic Lungs:  CTA B Normal respiratory effort, no intercostal retractions, no accessory muscle use. Heart: RRR,  no murmur.  Abdomen:  Not distended, soft, non-tender. No rebound or rigidity.   Lower extremities: no pretibial edema bilaterally  Skin: Exposed areas without rash. Not pale. Not jaundice Neurologic:  alert & oriented X3.  Speech normal, gait appropriate for age and unassisted Strength symmetric and appropriate for age.  Psych: Cognition and judgment appear intact.  Cooperative with normal attention span and concentration.  Behavior appropriate. No anxious or depressed appearing.     Assessment     Assessment Mild hypothyroidism (Rx levothyroxine  05/2020) Palpitations neg Holter 08-2015 Vitamin D  deficiency + FH CAD father early age Coronary calcium score 08/2022: 0 + FH of breast cancer mother early age BX (-) 2009 and   06/2024 +  FH colon cancer grossing 45 years old Not on BCP (condoms)   PLAN: Here for CPX Td 2022 Vax I rec: COVID vax ; Flu shot q year  Female care : per gyn + FH breast cancer, had a mammogram, recent MRI guided biopsy negative, to have another MRI in 6 months.   CCS: 36 y/o cousin recently dx w/ colon cancer, 3 options discussed.  Elected colonoscopy Labs: CMP FLP CBC TSH vitamin D  Lifestyle: Diet is healthy, exercises on and off.  Other issues. Hypothyroidism: On Synthroid , check TSH. FH CAD: Asymptomatic Vitamin D  deficiency: Last year vitamin D  continue to be low, had ergocalciferol .  Continue OTCs, checking levels. Moles: Referral to dermatology failed last year, will try again RTC 1 year

## 2024-07-20 ENCOUNTER — Ambulatory Visit: Payer: Self-pay | Admitting: Internal Medicine

## 2024-07-20 DIAGNOSIS — E785 Hyperlipidemia, unspecified: Secondary | ICD-10-CM

## 2024-09-03 ENCOUNTER — Ambulatory Visit: Payer: Self-pay

## 2024-09-03 ENCOUNTER — Ambulatory Visit: Admitting: Medical

## 2024-09-03 VITALS — BP 114/80 | HR 92 | Temp 98.4°F | Resp 16 | Ht 63.0 in | Wt 115.2 lb

## 2024-09-03 DIAGNOSIS — U071 COVID-19: Secondary | ICD-10-CM

## 2024-09-03 DIAGNOSIS — R52 Pain, unspecified: Secondary | ICD-10-CM | POA: Diagnosis not present

## 2024-09-03 DIAGNOSIS — R051 Acute cough: Secondary | ICD-10-CM | POA: Diagnosis not present

## 2024-09-03 LAB — POCT INFLUENZA A/B
Influenza A, POC: NEGATIVE
Influenza B, POC: NEGATIVE

## 2024-09-03 LAB — POCT RAPID STREP A (OFFICE): Rapid Strep A Screen: NEGATIVE

## 2024-09-03 LAB — POC COVID19 BINAXNOW: SARS Coronavirus 2 Ag: POSITIVE — AB

## 2024-09-03 MED ORDER — FLUTICASONE PROPIONATE 50 MCG/ACT NA SUSP: 2.0000 | Freq: Every day | NASAL | 1 refills | Status: DC

## 2024-09-03 MED ORDER — NIRMATRELVIR/RITONAVIR (PAXLOVID)TABLET: 3.0000 | ORAL_TABLET | Freq: Two times a day (BID) | ORAL | 0 refills | Status: AC

## 2024-09-03 MED ORDER — BENZONATATE 100 MG PO CAPS: 100.0000 mg | ORAL_CAPSULE | Freq: Three times a day (TID) | ORAL | 0 refills | Status: DC | PRN

## 2024-09-03 NOTE — Telephone Encounter (Signed)
 Appt scheduled

## 2024-09-03 NOTE — Patient Instructions (Addendum)
 COVID-19 infection(+) test with acute upper respiratory symptoms Confirmed COVID-19 with mild symptoms. Vaccinated, no chronic conditions. Paxlovid  not clearly indicated but discussed potential for secondary infections and symptom worsening. No contraindications with levothyroxine . - Prescribe Paxlovid  with option to start if symptoms worsen or patient chooses. Explained time frame to start within 5 days of symptoms onset) Rx advisement given - Prescribe benzonatate  for cough. - Recommend Flonase , two sprays each nostril daily, for congestion. - Advise ibuprofen  every eight hours for body aches, adjust dose per pain level. - Instruct to monitor for secondary infections and report significant symptoms.  Follow up date to be determined depending on how you do over next week

## 2024-09-03 NOTE — Progress Notes (Signed)
 Subjective:    Patient ID: Felicia Rollins, female    DOB: 22-Aug-1981, 43 y.o.   MRN: [id number]  HPI  Felicia Rollins is a 43 year old female who presents with symptoms of COVID-19.  She has been experiencing head congestion without drainage, body aches, chills, and night sweats since Wednesday morning. Initially suspecting allergies, she took Zyrtec on the first night. By Thursday, her symptoms worsened, prompting her to take DayQuil.  She has also developed a dry cough, sneezing, and clear nasal drainage. She also experienced a sore throat, which was worse the previous night, and a severe headache on Thursday that has since resolved. She continues to feel sinus pressure but notes improvement today.  No wheezing is present, and muscle aches have lessened. She has not experienced significant coughing or expectoration of mucus.  Her COVID-19 test returned positive, while flu and strep tests were negative. She has no history of asthma or diabetes and maintains good kidney function. She recalls a possible positive home COVID test in the past without symptoms and has received several COVID vaccinations, though it's been a while since the last one.  Covid + today in office  Current medications include levothyroxine  for hypothyroidism.   Lmp- 2-3 weeks ago.   Review of Systems  Constitutional:  Positive for chills, diaphoresis and fever.  HENT:  Positive for congestion, postnasal drip and sneezing.   Respiratory:  Positive for cough. Negative for choking, wheezing and stridor.   Cardiovascular:  Negative for chest pain and palpitations.  Gastrointestinal:  Negative for abdominal pain and diarrhea.  Genitourinary:  Negative for dysuria and urgency.  Musculoskeletal:  Positive for myalgias.  Skin:  Negative for rash.  Neurological:  Negative for facial asymmetry, light-headedness and headaches.       Ha yesterday gone now  Hematological:  Negative for adenopathy.   Psychiatric/Behavioral:  Negative for behavioral problems and decreased concentration.       Past Medical History:  Diagnosis Date   H/O hypokalemia    History of gastroenteritis    Hyperlipidemia    Muscle spasm    Frequent, keeps Mobic  and Flexeril  on hand   Palpitations      Social History   Socioeconomic History   Marital status: Married    Spouse name: Not on file   Number of children: 2   Years of education: Not on file   Highest education level: Not on file  Occupational History   Occupation: Futures trader   Occupation: desk job, from home   Tobacco Use   Smoking status: Never   Smokeless tobacco: Never  Substance and Sexual Activity   Alcohol use: No    Alcohol/week: 0.0 standard drinks of alcohol    Comment: rarely   Drug use: No   Sexual activity: Yes  Other Topics Concern   Not on file  Social History Narrative   Children: 2010 , 2013    Social Drivers of Corporate investment banker Strain: Not on [employer] Insecurity: Not on file  Transportation Needs: Not on file  Physical Activity: Not on file  Stress: Not on file  Social Connections: Not on file  Intimate Partner Violence: Not on file    Past Surgical History:  Procedure Laterality Date   BREAST BIOPSY Right 2009   benign   BREAST SURGERY     biopsy   DILATION AND CURETTAGE OF UTERUS     TONSILLECTOMY      Family History  Problem Relation Age of Onset   Breast cancer Mother        breast   Diabetes Father    Heart attack Father        early onset (late 72s)   Cancer Maternal Grandmother        liver   Aortic stenosis Maternal Grandfather    Diabetes Maternal Grandfather    Kidney disease Maternal Grandfather        failure   Diabetes Paternal Grandmother    Heart attack Paternal Grandfather    Breast cancer Paternal Aunt        breast   Colon cancer Cousin     Allergies  Allergen Reactions   Shellfish Allergy     Per allergy testing    Current Outpatient  Medications on File Prior to Visit  Medication Sig Dispense Refill   baclofen  (LIORESAL ) 10 MG tablet Take 0.5-1 tablets (5-10 mg total) by mouth 3 (three) times daily as needed for muscle spasms. 40 each 2   levothyroxine  (SYNTHROID ) 50 MCG tablet Take 1 tablet (50 mcg total) by mouth daily before breakfast. 90 tablet 1   Multiple Vitamin (MULTIVITAMIN WITH MINERALS) TABS tablet Take 1 tablet by mouth daily.     No current facility-administered medications on file prior to visit.    BP 114/80   Pulse 92   Temp 98.4 F (36.9 C) (Oral)   Resp 16   Ht 5' 3 (1.6 m)   Wt 115 lb 3.2 oz (52.3 kg)   LMP 08/17/2024 (Approximate)   SpO2 99%   BMI 20.41 kg/m        Objective:   Physical Exam  General Mental Status- Alert. General Appearance- Not in acute distress.   Skin General: Color- Normal Color. Moisture- Normal Moisture.  Neck Carotid Arteries- Normal color. Moisture- Normal Moisture. No carotid bruits. No JVD.  Chest and Lung Exam Auscultation: Breath Sounds:-Normal.  Cardiovascular Auscultation:Rythm- Regular. Murmurs & Other Heart Sounds:Auscultation of the heart reveals- No Murmurs.  Abdomen Inspection:-Inspeection Normal. Palpation/Percussion:Note:No mass. Palpation and Percussion of the abdomen reveal- Non Tender, Non Distended + BS, no rebound or guarding.   Neurologic Cranial Nerve exam:- CN III-XII intact(No nystagmus), symmetric smile. Strength:- 5/5 equal and symmetric strength both upper and lower extremities.       Assessment & Plan:   Patient Instructions  COVID-19 infection with acute upper respiratory symptoms Confirmed COVID-19 with mild symptoms. Vaccinated, no chronic conditions. Paxlovid  not clearly indicated but discussed potential for secondary infections and symptom worsening. No contraindications with levothyroxine . - Prescribe Paxlovid  with option to start if symptoms worsen or patient chooses. Explained time frame to start within 5  days of symptoms onset) Rx advisement given - Prescribe benzonatate  for cough. - Recommend Flonase , two sprays each nostril daily, for congestion. - Advise ibuprofen  every eight hours for body aches, adjust dose per pain level. - Instruct to monitor for secondary infections and report significant symptoms.  Follow up date to be determined depending on how you do over next week

## 2024-09-03 NOTE — Telephone Encounter (Signed)
 FYI Only or Action Required?: FYI only for provider.  Patient was last seen in primary care on 07/19/2024 by Amon Aloysius BRAVO, MD.  Called Nurse Triage reporting Sinusitis.  Symptoms began several days ago.  Interventions attempted: OTC medications: dayquil and nyquil and Rest, hydration, or home remedies.  Symptoms are: gradually worsening.  Triage Disposition: See PCP When Office is Open (Within 3 Days)  Patient/caregiver understands and will follow disposition?: Yes  Copied from CRM [id number]. Topic: Clinical - Red Word Triage >> Sep 03, 2024  8:02 AM Adelita E wrote: [facility] that prompted transfer to Nurse Triage: Cold-like symptoms going on for 2 days. Sore throat, body aches and pains, chills, sweats, a headache, and sinus pressure. Reason for Disposition  Lots of coughing  Answer Assessment - Initial Assessment Questions 1. LOCATION: Where does it hurt?      Headache  2. ONSET: When did the sinus pain start?  (e.g., hours, days)      Approx 2 days  3. SEVERITY: How bad is the pain?   (Scale 0-10; or none, mild, moderate or severe)     Better now, gone after taking Dayquil  4. RECURRENT SYMPTOM: Have you ever had sinus problems before? If Yes, ask: When was the last time? and What happened that time?      *No Answer* 5. NASAL CONGESTION: Is the nose blocked? If Yes, ask: Can you open it or must you breathe through your mouth?     No  6. NASAL DISCHARGE: Do you have discharge from your nose? If so ask, What color?     Yes, has been able to blow nose  7. FEVER: Do you have a fever? If Yes, ask: What is it, how was it measured, and when did it start?      No fever but sweating a lot during the night  8. OTHER SYMPTOMS: Do you have any other symptoms? (e.g., sore throat, cough, earache, difficulty breathing)     Sore throat and body aches  9. PREGNANCY: Is there any chance you are pregnant? When was your last menstrual period?     Lmp- unk  but it has been a few weaks  Protocols used: Sinus Pain or Congestion-A-AH

## 2024-09-06 ENCOUNTER — Ambulatory Visit (INDEPENDENT_AMBULATORY_CARE_PROVIDER_SITE_OTHER): Admitting: Family

## 2024-09-06 ENCOUNTER — Ambulatory Visit: Payer: Self-pay

## 2024-09-06 VITALS — BP 105/62 | HR 88 | Temp 97.8°F | Resp 16 | Ht 63.0 in | Wt 113.0 lb

## 2024-09-06 DIAGNOSIS — U071 COVID-19: Secondary | ICD-10-CM | POA: Diagnosis not present

## 2024-09-06 NOTE — Assessment & Plan Note (Addendum)
  COVID-19 infection with acute cough and chest congestion, no pneumonia, no wheezing.  - Recommend Mucinex as needed for chest congestion.  - Advise warm showers for steam relief.  - Suggest Tylenol  as needed for aches.  - Consider cough suppressant if cough worsens.  - Recommend fluids and rest.  - Suggest decongestant like Sudafed for secretions.  - Instruct to monitor for fever or shortness of breath and contact if symptoms worsen in 3-4 days.

## 2024-09-06 NOTE — Telephone Encounter (Signed)
  FYI Only or Action Required?: FYI only for provider.  Patient was last seen in primary care on 09/03/2024 by Saguier, Edward, PA-C.  Called Nurse Triage reporting Covid Positive.  Symptoms began several days ago.  Interventions attempted: Other: Seen in office.  Symptoms are: gradually worsening. Pt is experiencing chest tightness  Triage Disposition: See HCP Within 4 Hours (Or PCP Triage)  Patient/caregiver understands and will follow disposition?: YesCopied from CRM [id number]. Topic: Clinical - Red Word Triage >> Sep 06, 2024  9:50 AM Felicia Rollins wrote: Red Word that prompted transfer to Nurse Triage: patient called stating she was diagnosed with covid on Friday at the office but she still has the chest congestion, night sweats and tightness in her chest Reason for Disposition  MILD difficulty breathing (e.g., minimal/no SOB at rest, SOB with walking, pulse <100)  Answer Assessment - Initial Assessment Questions 1. COVID-19 DIAGNOSIS: How do you know that you have COVID? (e.g., positive lab test or self-test, diagnosed by doctor or NP/PA, symptoms after exposure).     Friday in the office 4. WORST SYMPTOM: What is your worst symptom? (e.g., cough, fever, shortness of breath, muscle aches)     Chest tightness 5. COUGH: Do you have a cough? If Yes, ask: How bad is the cough?       Yes - dry 8. BETTER-SAME-WORSE: Are you getting better, staying the same or getting worse compared to yesterday?  If getting worse, ask, In what way?     Now has chest tightness 9. OTHER SYMPTOMS: Do you have any other symptoms?  (e.g., chills, fatigue, headache, loss of smell or taste, muscle pain, sore throat)     Night sweats, chest congestion  Protocols used: Coronavirus (COVID-19) Diagnosed or Suspected-A-AH

## 2024-09-06 NOTE — Patient Instructions (Addendum)
 VISIT SUMMARY:  You visited today due to persistent chest congestion following a COVID-19 diagnosis. Your main symptoms included a severe headache, diarrhea, and a persistent cough with chest congestion.  YOUR PLAN:  COVID-19 INFECTION WITH ACUTE COUGH AND CHEST CONGESTION: You have a COVID-19 infection with symptoms of chest congestion and cough, but no pneumonia or wheezing. -Take Mucinex as needed for chest congestion. -Take warm showers to help with steam relief. -Use Tylenol  as needed for aches. -Consider using a cough suppressant such as tessalon  if your cough worsens. -Drink plenty of fluids and get rest. -You can use a decongestant like Sudafed for secretions. -Monitor for fever or shortness of breath and contact us  if your symptoms worsen in 3-4 days.  DIARRHEA ASSOCIATED WITH COVID-19 INFECTION: You have diarrhea as a symptom of your COVID-19 infection. -Continue to stay hydrated and monitor your symptoms.  HEADACHE ASSOCIATED WITH COVID-19 INFECTION: You had a severe headache on Thursday and Friday, but it has now resolved. -No specific treatment needed as your headache has resolved.

## 2024-09-06 NOTE — Telephone Encounter (Signed)
 Pt has an appt today.

## 2024-09-06 NOTE — Progress Notes (Signed)
 Subjective:     Patient ID: Felicia Rollins, female    DOB: Feb 18, 1981, 43 y.o.   MRN: [id number]  Chief Complaint  Patient presents with   Covid Positive    Tested positive on 09/03/24, complains of chest congestion    HPI  Discussed the use of AI scribe software for clinical note transcription with the patient, who gave verbal consent to proceed.  History of Present Illness  Felicia Rollins is a 43 year old female who presents with persistent chest congestion following a COVID-19 diagnosis. Symptoms began last Wednesday 9/3 with cold chills, body aches, and a sore throat. She tested positive for COVID-19 on Friday. Since then, she has experienced diarrhea, cough, sneezing, runny nose, and a severe headache, particularly on Thursday and Friday. The cold chills, body aches, and knee pain have resolved, and she has not had a fever. Her main concern is persistent chest congestion, described as a 'thick weight' that she is unable to cough up. She feels the need to cough more to clear it but finds it unproductive. She has tried hot tea with honey and hot showers to alleviate the symptoms. She has not taken the prescribed cough suppressant. She took Mucinex yesterday, but it did not provide relief. She reports no difficulty breathing.      Health Maintenance Due  Topic Date Due   Hepatitis B Vaccines 19-59 Average Risk (1 of 3 - 19+ 3-dose series) Never done   HPV VACCINES (1 - 3-dose SCDM series) Never done   Colonoscopy  Never done   Influenza Vaccine  07/30/2024   COVID-19 Vaccine (5 - 2025-26 season) 08/30/2024    Past Medical History:  Diagnosis Date   H/O hypokalemia    History of gastroenteritis    Hyperlipidemia    Muscle spasm    Frequent, keeps Mobic  and Flexeril  on hand   Palpitations     Past Surgical History:  Procedure Laterality Date   BREAST BIOPSY Right 2009   benign   BREAST SURGERY     biopsy   DILATION AND CURETTAGE OF UTERUS     TONSILLECTOMY       Family History  Problem Relation Age of Onset   Breast cancer Mother        breast   Diabetes Father    Heart attack Father        early onset (late 46s)   Cancer Maternal Grandmother        liver   Aortic stenosis Maternal Grandfather    Diabetes Maternal Grandfather    Kidney disease Maternal Grandfather        failure   Diabetes Paternal Grandmother    Heart attack Paternal Grandfather    Breast cancer Paternal Aunt        breast   Colon cancer Cousin     Social History   Socioeconomic History   Marital status: Married    Spouse name: Not on file   Number of children: 2   Years of education: Not on file   Highest education level: Not on file  Occupational History   Occupation: Futures trader   Occupation: desk job, from home   Tobacco Use   Smoking status: Never   Smokeless tobacco: Never  Substance and Sexual Activity   Alcohol use: No    Alcohol/week: 0.0 standard drinks of alcohol    Comment: rarely   Drug use: No   Sexual activity: Yes  Other Topics Concern   Not  on file  Social History Narrative   Children: 2010 , 2013    Social Drivers of Corporate investment banker Strain: Not on [employer] Insecurity: Not on file  Transportation Needs: Not on file  Physical Activity: Not on file  Stress: Not on file  Social Connections: Not on file  Intimate Partner Violence: Not on file    Outpatient Medications Prior to Visit  Medication Sig Dispense Refill   baclofen  (LIORESAL ) 10 MG tablet Take 0.5-1 tablets (5-10 mg total) by mouth 3 (three) times daily as needed for muscle spasms. 40 each 2   benzonatate  (TESSALON ) 100 MG capsule Take 1 capsule (100 mg total) by mouth 3 (three) times daily as needed for cough. 30 capsule 0   fluticasone  (FLONASE ) 50 MCG/ACT nasal spray Place 2 sprays into both nostrils daily. 16 g 1   levothyroxine  (SYNTHROID ) 50 MCG tablet Take 1 tablet (50 mcg total) by mouth daily before breakfast. 90 tablet 1   Multiple  Vitamin (MULTIVITAMIN WITH MINERALS) TABS tablet Take 1 tablet by mouth daily.     nirmatrelvir /ritonavir  (PAXLOVID ) 20 x 150 MG & 10 x 100MG  TABS Take 3 tablets by mouth 2 (two) times daily for 5 days. (Take nirmatrelvir  150 mg two tablets twice daily for 5 days and ritonavir  100 mg one tablet twice daily for 5 days) Patient GFR is 87 (Patient not taking: Reported on 09/06/2024) 30 tablet 0   No facility-administered medications prior to visit.    Allergies  Allergen Reactions   Shellfish Allergy     Per allergy testing    ROS    See HPI  Objective:    Physical Exam HENT:     Head: Normocephalic and atraumatic.     Right Ear: Tympanic membrane and ear canal normal.     Left Ear: Tympanic membrane and ear canal normal.     Mouth/Throat:     Mouth: Mucous membranes are moist.     Pharynx: No oropharyngeal exudate or posterior oropharyngeal erythema.  Cardiovascular:     Rate and Rhythm: Normal rate and regular rhythm.  Pulmonary:     Effort: Pulmonary effort is normal. No respiratory distress.     Breath sounds: Normal breath sounds. No wheezing or rales.  Musculoskeletal:     Cervical back: Neck supple. No tenderness.  Lymphadenopathy:     Cervical: No cervical adenopathy.  Skin:    General: Skin is warm and dry.  Neurological:     General: No focal deficit present.     Mental Status: She is alert and oriented to person, place, and time.  Psychiatric:        Mood and Affect: Mood normal.        Behavior: Behavior normal.      BP 105/62 (BP Location: Right Arm, Patient Position: Sitting, Cuff Size: Small)   Pulse 88   Temp 97.8 F (36.6 C) (Oral)   Resp 16   Ht 5' 3 (1.6 m)   Wt 113 lb (51.3 kg)   LMP 08/17/2024 (Approximate)   SpO2 99%   BMI 20.02 kg/m  Wt Readings from Last 3 Encounters:  09/06/24 113 lb (51.3 kg)  09/03/24 115 lb 3.2 oz (52.3 kg)  07/19/24 119 lb 6 oz (54.1 kg)       Assessment & Plan:   Problem List Items Addressed This Visit        Unprioritized   COVID-19 virus infection - Primary    COVID-19 infection with  acute cough and chest congestion, no pneumonia, no wheezing.  - Recommend Mucinex as needed for chest congestion.  - Advise warm showers for steam relief.  - Suggest Tylenol  as needed for aches.  - Consider cough suppressant if cough worsens.  - Recommend fluids and rest.  - Suggest decongestant like Sudafed for secretions.  - Instruct to monitor for fever or shortness of breath and contact if symptoms worsen in 3-4 days.        I am having Felicia Rollins maintain her multivitamin with minerals, baclofen , levothyroxine , nirmatrelvir /ritonavir , benzonatate , and fluticasone .  No orders of the defined types were placed in this encounter.

## 2024-09-25 ENCOUNTER — Other Ambulatory Visit: Payer: Self-pay | Admitting: Medical

## 2024-09-29 ENCOUNTER — Ambulatory Visit (INDEPENDENT_AMBULATORY_CARE_PROVIDER_SITE_OTHER)

## 2024-09-29 DIAGNOSIS — Z23 Encounter for immunization: Secondary | ICD-10-CM | POA: Diagnosis not present

## 2024-10-04 ENCOUNTER — Encounter: Payer: Self-pay | Admitting: Internal Medicine

## 2025-01-03 DIAGNOSIS — R928 Other abnormal and inconclusive findings on diagnostic imaging of breast: Secondary | ICD-10-CM

## 2025-01-20 ENCOUNTER — Other Ambulatory Visit

## 2025-01-20 ENCOUNTER — Other Ambulatory Visit: Payer: Self-pay | Admitting: Internal Medicine

## 2025-01-20 DIAGNOSIS — E785 Hyperlipidemia, unspecified: Secondary | ICD-10-CM | POA: Diagnosis not present

## 2025-01-20 LAB — LIPID PANEL
Cholesterol: 208 mg/dL — ABNORMAL HIGH (ref 28–200)
HDL: 38.7 mg/dL — ABNORMAL LOW
LDL Cholesterol: 142 mg/dL — ABNORMAL HIGH (ref 10–99)
NonHDL: 169.32
Total CHOL/HDL Ratio: 5
Triglycerides: 137 mg/dL (ref 10.0–149.0)
VLDL: 27.4 mg/dL (ref 0.0–40.0)

## 2025-01-21 ENCOUNTER — Ambulatory Visit

## 2025-01-21 ENCOUNTER — Encounter: Payer: Self-pay | Admitting: Internal Medicine

## 2025-01-21 ENCOUNTER — Ambulatory Visit: Payer: Self-pay | Admitting: Internal Medicine

## 2025-01-21 VITALS — Ht 63.0 in | Wt 115.0 lb

## 2025-01-21 DIAGNOSIS — Z1211 Encounter for screening for malignant neoplasm of colon: Secondary | ICD-10-CM

## 2025-01-21 MED ORDER — NA SULFATE-K SULFATE-MG SULF 17.5-3.13-1.6 GM/177ML PO SOLN: 1.0000 | Freq: Once | ORAL | 0 refills | Status: AC

## 2025-01-21 NOTE — Progress Notes (Signed)
 PCP MD at time of PV: Paz, MD  __________________________________________________________________________________________________________________________________________  No egg allergy known to patient  No soy allergy known to patient No issues known to pt with past sedation with any surgeries or procedures Patient denies ever being told they had issues or difficulty with intubation  No FH of Malignant Hyperthermia Pt is not on diet pills Pt is not on  home 02  Pt is not on blood thinners  No A fib or A flutter Have any cardiac testing pending--no  LOA: independent  No Chew or Snuff tobacco __________________________________________________________________________________________________________________________________________  Constipation: no  Prep: suprep  __________________________________________________________________________________________________________________________________________  PV completed with patient. Prep instructions reviewed and provided during apt. Rx sent to preferred pharmacy.  __________________________________________________________________________________________________________________________________________  Patient's chart reviewed by Norleen Schillings CNRA prior to previsit and patient appropriate for the [facility].  Previsit completed and red dot placed by patient's name on their procedure day (on provider's schedule).

## 2025-01-30 ENCOUNTER — Other Ambulatory Visit

## 2025-01-31 ENCOUNTER — Telehealth: Payer: Self-pay

## 2025-01-31 NOTE — Telephone Encounter (Signed)
 Due to inclement weather rescheduled colonoscopy  to 02/14/25

## 2025-02-01 ENCOUNTER — Encounter: Admitting: Internal Medicine

## 2025-02-14 ENCOUNTER — Encounter: Admitting: Internal Medicine

## 2025-02-24 ENCOUNTER — Other Ambulatory Visit

## 2025-07-20 ENCOUNTER — Encounter: Admitting: Internal Medicine
